# Patient Record
Sex: Female | Born: 1950 | Race: White | Hispanic: No | Marital: Married | Smoking: Former smoker
Health system: Southern US, Community
[De-identification: ages and names within clinical notes are randomized; demographics above are authoritative.]

## PROBLEM LIST (undated history)

## (undated) DIAGNOSIS — M1990 Unspecified osteoarthritis, unspecified site: Secondary | ICD-10-CM

## (undated) DIAGNOSIS — Z9889 Other specified postprocedural states: Secondary | ICD-10-CM

## (undated) DIAGNOSIS — M6530 Trigger finger, unspecified finger: Secondary | ICD-10-CM

## (undated) DIAGNOSIS — K219 Gastro-esophageal reflux disease without esophagitis: Secondary | ICD-10-CM

## (undated) DIAGNOSIS — R112 Nausea with vomiting, unspecified: Secondary | ICD-10-CM

## (undated) DIAGNOSIS — B019 Varicella without complication: Secondary | ICD-10-CM

## (undated) DIAGNOSIS — K635 Polyp of colon: Secondary | ICD-10-CM

## (undated) HISTORY — DX: Unspecified osteoarthritis, unspecified site: M19.90

## (undated) HISTORY — DX: Varicella without complication: B01.9

## (undated) HISTORY — DX: Polyp of colon: K63.5

## (undated) HISTORY — PX: TRIGGER FINGER RELEASE: SHX641

## (undated) HISTORY — DX: Trigger finger, unspecified finger: M65.30

## (undated) HISTORY — PX: BREAST BIOPSY: SHX20

## (undated) HISTORY — PX: TUBAL LIGATION: SHX77

## (undated) HISTORY — PX: KNEE ARTHROSCOPY: SUR90

---
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
## 1986-12-25 HISTORY — PX: AUGMENTATION MAMMAPLASTY: SUR837

## 2010-12-25 LAB — HM COLONOSCOPY

## 2012-06-25 ENCOUNTER — Ambulatory Visit (INDEPENDENT_AMBULATORY_CARE_PROVIDER_SITE_OTHER): Payer: PRIVATE HEALTH INSURANCE | Admitting: Internal Medicine

## 2012-06-25 ENCOUNTER — Encounter: Payer: Self-pay | Admitting: Internal Medicine

## 2012-06-25 VITALS — BP 140/80 | HR 78 | Temp 98.8°F | Ht 63.25 in | Wt 124.5 lb

## 2012-06-25 DIAGNOSIS — M1990 Unspecified osteoarthritis, unspecified site: Secondary | ICD-10-CM

## 2012-06-25 DIAGNOSIS — F411 Generalized anxiety disorder: Secondary | ICD-10-CM

## 2012-06-25 DIAGNOSIS — M129 Arthropathy, unspecified: Secondary | ICD-10-CM

## 2012-06-25 DIAGNOSIS — Z0000 Encounter for general adult medical examination without abnormal findings: Secondary | ICD-10-CM

## 2012-06-25 DIAGNOSIS — F419 Anxiety disorder, unspecified: Secondary | ICD-10-CM | POA: Insufficient documentation

## 2012-06-25 LAB — CBC WITH DIFFERENTIAL/PLATELET
Basophils Relative: 1 % (ref 0.0–3.0)
Eosinophils Relative: 1.4 % (ref 0.0–5.0)
HCT: 41.9 % (ref 36.0–46.0)
Lymphs Abs: 2.1 10*3/uL (ref 0.7–4.0)
MCV: 89.8 fl (ref 78.0–100.0)
Monocytes Absolute: 0.6 10*3/uL (ref 0.1–1.0)
Platelets: 233 10*3/uL (ref 150.0–400.0)
WBC: 6.7 10*3/uL (ref 4.5–10.5)

## 2012-06-25 LAB — COMPREHENSIVE METABOLIC PANEL
Alkaline Phosphatase: 73 U/L (ref 39–117)
BUN: 20 mg/dL (ref 6–23)
Creatinine, Ser: 0.7 mg/dL (ref 0.4–1.2)
Glucose, Bld: 104 mg/dL — ABNORMAL HIGH (ref 70–99)
Sodium: 140 mEq/L (ref 135–145)
Total Bilirubin: 1.6 mg/dL — ABNORMAL HIGH (ref 0.3–1.2)

## 2012-06-25 LAB — LIPID PANEL
Cholesterol: 231 mg/dL — ABNORMAL HIGH (ref 0–200)
HDL: 76 mg/dL (ref >39.00)
Triglycerides: 65 mg/dL (ref 0.0–149.0)
VLDL: 13 mg/dL (ref 0.0–40.0)

## 2012-06-25 LAB — LDL CHOLESTEROL, DIRECT: Direct LDL: 136.7 mg/dL

## 2012-06-25 MED ORDER — LORAZEPAM 0.5 MG PO TABS: 0.5000 mg | ORAL_TABLET | Freq: Two times a day (BID) | ORAL | Status: AC | PRN

## 2012-06-25 NOTE — Assessment & Plan Note (Signed)
Patient with arthralgia and thickened PIP joints on exam. Symptoms are most consistent with rheumatoid arthritis. Will check ANA, rheumatoid factor, ESR, CRP with labs. She will continue to use nonsteroidals as needed. Followup one month.

## 2012-06-25 NOTE — Progress Notes (Signed)
Subjective:    Patient ID: Rachael Graham, female    DOB: 1951-04-01, 61 y.o.   MRN: [id number]  HPI 62 year old female with history of arthralgia presents to establish care. She reports she is generally feeling well. She has chronic history of joint pain mostly in her hands and her right shoulder. She notes that the pain in her hands most prominent in the morning but improves with use. She notes some thickened joints especially in her hands. This has limited use of her hands. She also notes some pain in her right shoulder with movement. However, she attributes this to overuse from previous history of playing tennis. She does not have weakness in her right arm.  She also notes a history of anxiety in the past when flying. In the past, she has taken lorazepam with resolution of her symptoms.  Aside from this, she reports she is feeling well. She is up-to-date on health maintenance.  Outpatient Encounter Prescriptions as of 06/25/2012  Medication Sig Dispense Refill  . diphenhydrAMINE (SOMINEX) 25 MG tablet Take 25 mg by mouth at bedtime as needed.      Marland Kitchen LORazepam (ATIVAN) 0.5 MG tablet Take 1 tablet (0.5 mg total) by mouth 2 (two) times daily as needed for anxiety.  60 tablet  3    Review of Systems  Constitutional: Negative for fever, chills, appetite change, fatigue and unexpected weight change.  HENT: Negative for ear pain, congestion, sore throat, trouble swallowing, neck pain, voice change and sinus pressure.   Eyes: Negative for visual disturbance.  Respiratory: Negative for cough, shortness of breath, wheezing and stridor.   Cardiovascular: Negative for chest pain, palpitations and leg swelling.  Gastrointestinal: Negative for nausea, vomiting, abdominal pain, diarrhea, constipation, blood in stool, abdominal distention and anal bleeding.  Genitourinary: Negative for dysuria and flank pain.  Musculoskeletal: Positive for joint swelling and arthralgias. Negative for myalgias and gait  problem.  Skin: Negative for color change and rash.  Neurological: Negative for dizziness and headaches.  Hematological: Negative for adenopathy. Does not bruise/bleed easily.  Psychiatric/Behavioral: Negative for suicidal ideas, disturbed wake/sleep cycle and dysphoric mood. The patient is nervous/anxious (with air travel).    BP 140/80  Pulse 78  Temp 98.8 F (37.1 C) (Oral)  Ht 5' 3.25" (1.607 m)  Wt 124 lb 8 oz (56.473 kg)  BMI 21.88 kg/m2  SpO2 97%     Objective:   Physical Exam  Constitutional: She is oriented to person, place, and time. She appears well-developed and well-nourished. No distress.  HENT:  Head: Normocephalic and atraumatic.  Right Ear: External ear normal.  Left Ear: External ear normal.  Nose: Nose normal.  Mouth/Throat: Oropharynx is clear and moist. No oropharyngeal exudate.  Eyes: Conjunctivae are normal. Pupils are equal, round, and reactive to light. Right eye exhibits no discharge. Left eye exhibits no discharge. No scleral icterus.  Neck: Normal range of motion. Neck supple. No tracheal deviation present. No thyromegaly present.  Cardiovascular: Normal rate, regular rhythm, normal heart sounds and intact distal pulses.  Exam reveals no gallop and no friction rub.   No murmur heard. Pulmonary/Chest: Effort normal and breath sounds normal. No respiratory distress. She has no wheezes. She has no rales. She exhibits no tenderness.  Abdominal: Soft. Bowel sounds are normal. She exhibits no distension and no mass. There is no tenderness. There is no guarding.  Musculoskeletal: She exhibits no edema and no tenderness.       Right hand: She exhibits decreased range of motion  and bony tenderness.       Left hand: She exhibits decreased range of motion and bony tenderness.  Lymphadenopathy:    She has no cervical adenopathy.  Neurological: She is alert and oriented to person, place, and time. No cranial nerve deficit. She exhibits normal muscle tone.  Coordination normal.  Skin: Skin is warm and dry. No rash noted. She is not diaphoretic. No erythema. No pallor.  Psychiatric: She has a normal mood and affect. Her behavior is normal. Judgment and thought content normal.          Assessment & Plan:

## 2012-06-25 NOTE — Assessment & Plan Note (Signed)
Patient has some anxiety with air travel. Will continue lorazepam as needed.

## 2012-06-26 LAB — RHEUMATOID FACTOR: Rhuematoid fact SerPl-aCnc: <10 IU/mL (ref <=14)

## 2012-07-30 ENCOUNTER — Ambulatory Visit: Payer: PRIVATE HEALTH INSURANCE | Admitting: Internal Medicine

## 2012-10-23 ENCOUNTER — Ambulatory Visit: Payer: PRIVATE HEALTH INSURANCE | Admitting: Internal Medicine

## 2012-11-14 ENCOUNTER — Ambulatory Visit (INDEPENDENT_AMBULATORY_CARE_PROVIDER_SITE_OTHER): Payer: PRIVATE HEALTH INSURANCE

## 2012-11-14 DIAGNOSIS — Z23 Encounter for immunization: Secondary | ICD-10-CM

## 2013-02-16 LAB — HM MAMMOGRAPHY: HM Mammogram: NORMAL

## 2013-10-15 ENCOUNTER — Encounter: Payer: PRIVATE HEALTH INSURANCE | Admitting: Internal Medicine

## 2013-10-16 ENCOUNTER — Encounter: Payer: Self-pay | Admitting: Internal Medicine

## 2013-10-16 ENCOUNTER — Ambulatory Visit (INDEPENDENT_AMBULATORY_CARE_PROVIDER_SITE_OTHER): Payer: 59 | Admitting: Internal Medicine

## 2013-10-16 VITALS — BP 122/72 | HR 74 | Temp 98.3°F | Ht 63.0 in | Wt 128.0 lb

## 2013-10-16 DIAGNOSIS — Z23 Encounter for immunization: Secondary | ICD-10-CM

## 2013-10-16 DIAGNOSIS — Z0000 Encounter for general adult medical examination without abnormal findings: Secondary | ICD-10-CM

## 2013-10-16 DIAGNOSIS — Z1239 Encounter for other screening for malignant neoplasm of breast: Secondary | ICD-10-CM | POA: Insufficient documentation

## 2013-10-16 DIAGNOSIS — R9431 Abnormal electrocardiogram [ECG] [EKG]: Secondary | ICD-10-CM

## 2013-10-16 MED ORDER — LORAZEPAM 0.5 MG PO TABS: 0.5000 mg | ORAL_TABLET | Freq: Two times a day (BID) | ORAL | Status: DC | PRN

## 2013-10-16 NOTE — Progress Notes (Signed)
Subjective:    Patient ID: Rachael Graham, female    DOB: 06-02-1951, 62 y.o.   MRN: [id number]  HPI 62 year old female presents for annual exam. She reports she is generally feeling well. She denies any concerns today. She continues to follow a healthy diet and get regular physical activity. She was recently seen by her OB/GYN who performed breast exam, pelvic exam, and Pap smear which she reports were normal. She would like to have a flu shot today.  Outpatient Encounter Prescriptions as of 10/16/2013  Medication Sig Dispense Refill  . diphenhydrAMINE (SOMINEX) 25 MG tablet Take 25 mg by mouth at bedtime as needed.      Marland Kitchen LORazepam (ATIVAN) 0.5 MG tablet Take 1 tablet (0.5 mg total) by mouth 2 (two) times daily as needed for anxiety.  60 tablet  3   No facility-administered encounter medications on file as of 10/16/2013.   BP 122/72  Pulse 74  Temp(Src) 98.3 F (36.8 C) (Oral)  Ht 5\' 3"  (1.6 m)  Wt 128 lb (58.06 kg)  BMI 22.68 kg/m2  SpO2 95%  Review of Systems  Constitutional: Negative for fever, chills, appetite change, fatigue and unexpected weight change.  HENT: Negative for congestion, ear pain, sinus pressure, sore throat, trouble swallowing and voice change.   Eyes: Negative for visual disturbance.  Respiratory: Negative for cough, shortness of breath, wheezing and stridor.   Cardiovascular: Negative for chest pain, palpitations and leg swelling.  Gastrointestinal: Negative for nausea, vomiting, abdominal pain, diarrhea, constipation, blood in stool, abdominal distention and anal bleeding.  Genitourinary: Negative for dysuria and flank pain.  Musculoskeletal: Negative for arthralgias, gait problem, myalgias and neck pain.  Skin: Negative for color change and rash.  Neurological: Negative for dizziness and headaches.  Hematological: Negative for adenopathy. Does not bruise/bleed easily.  Psychiatric/Behavioral: Negative for suicidal ideas, sleep disturbance and dysphoric  mood. The patient is not nervous/anxious.        Objective:   Physical Exam  Constitutional: She is oriented to person, place, and time. She appears well-developed and well-nourished. No distress.  HENT:  Head: Normocephalic and atraumatic.  Right Ear: External ear normal.  Left Ear: External ear normal.  Nose: Nose normal.  Mouth/Throat: Oropharynx is clear and moist. No oropharyngeal exudate.  Eyes: Conjunctivae are normal. Pupils are equal, round, and reactive to light. Right eye exhibits no discharge. Left eye exhibits no discharge. No scleral icterus.  Neck: Normal range of motion. Neck supple. No tracheal deviation present. No thyromegaly present.  Cardiovascular: Normal rate, regular rhythm, normal heart sounds and intact distal pulses.  Exam reveals no gallop and no friction rub.   No murmur heard. Pulmonary/Chest: Effort normal and breath sounds normal. No accessory muscle usage. Not tachypneic. No respiratory distress. She has no decreased breath sounds. She has no wheezes. She has no rhonchi. She has no rales. She exhibits no tenderness.  Abdominal: Soft. Bowel sounds are normal. She exhibits no distension. There is no tenderness.  Musculoskeletal: Normal range of motion. She exhibits no edema and no tenderness.  Lymphadenopathy:    She has no cervical adenopathy.  Neurological: She is alert and oriented to person, place, and time. No cranial nerve deficit. She exhibits normal muscle tone. Coordination normal.  Skin: Skin is warm and dry. No rash noted. She is not diaphoretic. No erythema. No pallor.  Psychiatric: She has a normal mood and affect. Her behavior is normal. Judgment and thought content normal.  Assessment & Plan:

## 2013-10-16 NOTE — Assessment & Plan Note (Signed)
General medical exam normal today. Breast and pelvic exam deferred as recently completed by her OB/GYN. Will request recent Pap smear report, mammogram report. Encouraged continued healthy diet and regular physical activity. Will check fasting labs including CBC, CMP, lipid profile. Followup one year and as needed.

## 2013-10-27 ENCOUNTER — Other Ambulatory Visit: Payer: 59

## 2013-11-10 ENCOUNTER — Other Ambulatory Visit (INDEPENDENT_AMBULATORY_CARE_PROVIDER_SITE_OTHER): Payer: 59

## 2013-11-10 DIAGNOSIS — Z0000 Encounter for general adult medical examination without abnormal findings: Secondary | ICD-10-CM

## 2013-11-10 LAB — COMPREHENSIVE METABOLIC PANEL
ALT: 31 U/L (ref 0–35)
AST: 28 U/L (ref 0–37)
Alkaline Phosphatase: 77 U/L (ref 39–117)
BUN: 20 mg/dL (ref 6–23)
Calcium: 9.5 mg/dL (ref 8.4–10.5)
Creatinine, Ser: 0.7 mg/dL (ref 0.4–1.2)
Total Bilirubin: 1.3 mg/dL — ABNORMAL HIGH (ref 0.3–1.2)

## 2013-11-10 LAB — TSH: TSH: 1.24 u[IU]/mL (ref 0.35–5.50)

## 2013-11-10 LAB — LIPID PANEL
Cholesterol: 231 mg/dL — ABNORMAL HIGH (ref 0–200)
HDL: 64.4 mg/dL (ref >39.00)
Total CHOL/HDL Ratio: 4
Triglycerides: 58 mg/dL (ref 0.0–149.0)
VLDL: 11.6 mg/dL (ref 0.0–40.0)

## 2013-11-10 LAB — CBC WITH DIFFERENTIAL/PLATELET
Basophils Absolute: 0 10*3/uL (ref 0.0–0.1)
Eosinophils Relative: 1.7 % (ref 0.0–5.0)
Lymphocytes Relative: 38.8 % (ref 12.0–46.0)
Lymphs Abs: 2.3 10*3/uL (ref 0.7–4.0)
MCV: 86.6 fl (ref 78.0–100.0)
Monocytes Absolute: 0.5 10*3/uL (ref 0.1–1.0)
Neutrophils Relative %: 50.7 % (ref 43.0–77.0)
Platelets: 246 10*3/uL (ref 150.0–400.0)
RBC: 4.77 Mil/uL (ref 3.87–5.11)
RDW: 12.8 % (ref 11.5–14.6)
WBC: 5.8 10*3/uL (ref 4.5–10.5)

## 2013-11-11 LAB — VITAMIN D 25 HYDROXY (VIT D DEFICIENCY, FRACTURES): Vit D, 25-Hydroxy: 52 ng/mL (ref 30–89)

## 2014-09-10 ENCOUNTER — Telehealth: Payer: Self-pay | Admitting: Internal Medicine

## 2014-10-01 ENCOUNTER — Ambulatory Visit (INDEPENDENT_AMBULATORY_CARE_PROVIDER_SITE_OTHER): Payer: 59 | Admitting: Internal Medicine

## 2014-10-01 ENCOUNTER — Encounter: Payer: Self-pay | Admitting: Internal Medicine

## 2014-10-01 VITALS — BP 136/82 | HR 72 | Temp 98.3°F | Ht 63.0 in | Wt 127.0 lb

## 2014-10-01 DIAGNOSIS — Z0000 Encounter for general adult medical examination without abnormal findings: Secondary | ICD-10-CM

## 2014-10-01 DIAGNOSIS — F419 Anxiety disorder, unspecified: Secondary | ICD-10-CM

## 2014-10-01 MED ORDER — LORAZEPAM 0.5 MG PO TABS: 0.5000 mg | ORAL_TABLET | Freq: Two times a day (BID) | ORAL | Status: DC | PRN

## 2014-10-01 NOTE — Patient Instructions (Signed)
Labs in November.  Follow up in 1 year or sooner as needed.

## 2014-10-01 NOTE — Assessment & Plan Note (Signed)
Symptoms well controlled with only prn Lorazepam. Will continue.

## 2014-10-01 NOTE — Progress Notes (Signed)
Pre visit review using our clinic review tool, if applicable. No additional management support is needed unless otherwise documented below in the visit note. 

## 2014-10-01 NOTE — Progress Notes (Signed)
   Subjective:    Patient ID: Rachael Graham, female    DOB: 11/17/1951, 63 y.o.   MRN: [id number]  HPI 63YO female presents for follow up.  Generally, feeling well. No concerns today. Plans to follow up with OB for pelvic, pap and mammogram. Would like to refill Lorazepam, as she uses this for anxiety during flights.  Review of Systems  Constitutional: Negative for fever, chills, appetite change, fatigue and unexpected weight change.  Eyes: Negative for visual disturbance.  Respiratory: Negative for shortness of breath.   Cardiovascular: Negative for chest pain and leg swelling.  Gastrointestinal: Negative for abdominal pain.  Skin: Negative for color change and rash.  Hematological: Negative for adenopathy. Does not bruise/bleed easily.  Psychiatric/Behavioral: Negative for dysphoric mood. The patient is nervous/anxious (during flights).        Objective:    BP 136/82  Pulse 72  Temp(Src) 98.3 F (36.8 C) (Oral)  Ht 5\' 3"  (1.6 m)  Wt 127 lb (57.607 kg)  BMI 22.50 kg/m2  SpO2 96% Physical Exam  Constitutional: She is oriented to person, place, and time. She appears well-developed and well-nourished. No distress.  HENT:  Head: Normocephalic and atraumatic.  Right Ear: External ear normal.  Left Ear: External ear normal.  Nose: Nose normal.  Mouth/Throat: Oropharynx is clear and moist. No oropharyngeal exudate.  Eyes: Conjunctivae are normal. Pupils are equal, round, and reactive to light. Right eye exhibits no discharge. Left eye exhibits no discharge. No scleral icterus.  Neck: Normal range of motion. Neck supple. No tracheal deviation present. No thyromegaly present.  Cardiovascular: Normal rate, regular rhythm, normal heart sounds and intact distal pulses.  Exam reveals no gallop and no friction rub.   No murmur heard. Pulmonary/Chest: Effort normal and breath sounds normal. No accessory muscle usage. Not tachypneic. No respiratory distress. She has no decreased breath  sounds. She has no wheezes. She has no rhonchi. She has no rales. She exhibits no tenderness.  Musculoskeletal: Normal range of motion. She exhibits no edema and no tenderness.  Lymphadenopathy:    She has no cervical adenopathy.  Neurological: She is alert and oriented to person, place, and time. No cranial nerve deficit. She exhibits normal muscle tone. Coordination normal.  Skin: Skin is warm and dry. No rash noted. She is not diaphoretic. No erythema. No pallor.  Psychiatric: She has a normal mood and affect. Her behavior is normal. Judgment and thought content normal.          Assessment & Plan:   Problem List Items Addressed This Visit     Unprioritized   Anxiety - Primary     Symptoms well controlled with only prn Lorazepam. Will continue.    Relevant Medications      LORazepam (ATIVAN) tablet   Routine general medical examination at a health care facility   Relevant Medications      LORazepam (ATIVAN) tablet   Other Relevant Orders      CBC with Differential      Comprehensive metabolic panel      Lipid panel      Microalbumin / creatinine urine ratio      Vit D  25 hydroxy (rtn osteoporosis monitoring)      TSH       Return in about 1 year (around 10/02/2015) for Recheck.

## 2014-10-19 ENCOUNTER — Other Ambulatory Visit: Payer: 59

## 2014-10-26 ENCOUNTER — Other Ambulatory Visit (INDEPENDENT_AMBULATORY_CARE_PROVIDER_SITE_OTHER): Payer: 59

## 2014-10-26 ENCOUNTER — Other Ambulatory Visit: Payer: Self-pay | Admitting: *Deleted

## 2014-10-26 DIAGNOSIS — Z0000 Encounter for general adult medical examination without abnormal findings: Secondary | ICD-10-CM

## 2014-10-26 DIAGNOSIS — E875 Hyperkalemia: Secondary | ICD-10-CM

## 2014-10-26 LAB — MICROALBUMIN / CREATININE URINE RATIO
Creatinine,U: 40.1 mg/dL
MICROALB UR: 0.7 mg/dL (ref 0.0–1.9)
MICROALB/CREAT RATIO: 1.7 mg/g (ref 0.0–30.0)

## 2014-10-26 LAB — COMPREHENSIVE METABOLIC PANEL
ALT: 21 U/L (ref 0–35)
AST: 21 U/L (ref 0–37)
Albumin: 3.8 g/dL (ref 3.5–5.2)
Alkaline Phosphatase: 71 U/L (ref 39–117)
BUN: 16 mg/dL (ref 6–23)
CO2: 27 meq/L (ref 19–32)
Calcium: 9.5 mg/dL (ref 8.4–10.5)
Chloride: 104 mEq/L (ref 96–112)
Creatinine, Ser: 0.8 mg/dL (ref 0.4–1.2)
GFR: 77.51 mL/min (ref >60.00)
Glucose, Bld: 87 mg/dL (ref 70–99)
Potassium: 5.5 mEq/L — ABNORMAL HIGH (ref 3.5–5.1)
Sodium: 141 mEq/L (ref 135–145)
Total Bilirubin: 1.9 mg/dL — ABNORMAL HIGH (ref 0.2–1.2)
Total Protein: 7.1 g/dL (ref 6.0–8.3)

## 2014-10-26 LAB — VITAMIN D 25 HYDROXY (VIT D DEFICIENCY, FRACTURES): VITD: 36.54 ng/mL (ref 30.00–100.00)

## 2014-10-26 LAB — CBC WITH DIFFERENTIAL/PLATELET
Basophils Absolute: 0.1 10*3/uL (ref 0.0–0.1)
Basophils Relative: 0.8 % (ref 0.0–3.0)
EOS PCT: 3.4 % (ref 0.0–5.0)
Eosinophils Absolute: 0.2 10*3/uL (ref 0.0–0.7)
HEMATOCRIT: 42.1 % (ref 36.0–46.0)
HEMOGLOBIN: 14 g/dL (ref 12.0–15.0)
LYMPHS ABS: 2.3 10*3/uL (ref 0.7–4.0)
Lymphocytes Relative: 37.2 % (ref 12.0–46.0)
MCHC: 33.2 g/dL (ref 30.0–36.0)
MCV: 87.8 fl (ref 78.0–100.0)
MONO ABS: 0.6 10*3/uL (ref 0.1–1.0)
Monocytes Relative: 10.5 % (ref 3.0–12.0)
NEUTROS ABS: 3 10*3/uL (ref 1.4–7.7)
Neutrophils Relative %: 48.1 % (ref 43.0–77.0)
PLATELETS: 230 10*3/uL (ref 150.0–400.0)
RBC: 4.79 Mil/uL (ref 3.87–5.11)
RDW: 13.2 % (ref 11.5–15.5)
WBC: 6.2 10*3/uL (ref 4.0–10.5)

## 2014-10-26 LAB — LIPID PANEL
Cholesterol: 218 mg/dL — ABNORMAL HIGH (ref 0–200)
HDL: 60.9 mg/dL (ref >39.00)
LDL Cholesterol: 138 mg/dL — ABNORMAL HIGH (ref 0–99)
NONHDL: 157.1
Total CHOL/HDL Ratio: 4
Triglycerides: 96 mg/dL (ref 0.0–149.0)
VLDL: 19.2 mg/dL (ref 0.0–40.0)

## 2014-10-26 LAB — TSH: TSH: 0.42 u[IU]/mL (ref 0.35–4.50)

## 2014-10-29 ENCOUNTER — Other Ambulatory Visit (INDEPENDENT_AMBULATORY_CARE_PROVIDER_SITE_OTHER): Payer: 59

## 2014-10-29 DIAGNOSIS — E875 Hyperkalemia: Secondary | ICD-10-CM

## 2014-10-29 LAB — BASIC METABOLIC PANEL
BUN: 17 mg/dL (ref 6–23)
CALCIUM: 9.2 mg/dL (ref 8.4–10.5)
CO2: 31 meq/L (ref 19–32)
Chloride: 104 mEq/L (ref 96–112)
Creatinine, Ser: 1 mg/dL (ref 0.4–1.2)
GFR: 62.05 mL/min (ref >60.00)
Glucose, Bld: 89 mg/dL (ref 70–99)
Potassium: 4.8 mEq/L (ref 3.5–5.1)
SODIUM: 141 meq/L (ref 135–145)

## 2015-01-05 LAB — HM MAMMOGRAPHY: HM Mammogram: NORMAL

## 2015-01-05 LAB — HM PAP SMEAR: HM PAP: NORMAL

## 2015-10-04 ENCOUNTER — Ambulatory Visit: Payer: 59 | Admitting: Internal Medicine

## 2015-10-06 ENCOUNTER — Ambulatory Visit (INDEPENDENT_AMBULATORY_CARE_PROVIDER_SITE_OTHER): Payer: 59 | Admitting: Internal Medicine

## 2015-10-06 ENCOUNTER — Encounter: Payer: Self-pay | Admitting: Internal Medicine

## 2015-10-06 VITALS — BP 136/71 | HR 73 | Temp 98.1°F | Ht 63.0 in | Wt 127.5 lb

## 2015-10-06 DIAGNOSIS — F419 Anxiety disorder, unspecified: Secondary | ICD-10-CM

## 2015-10-06 DIAGNOSIS — Z23 Encounter for immunization: Secondary | ICD-10-CM

## 2015-10-06 DIAGNOSIS — Z0000 Encounter for general adult medical examination without abnormal findings: Secondary | ICD-10-CM

## 2015-10-06 MED ORDER — LORAZEPAM 0.5 MG PO TABS: 0.5000 mg | ORAL_TABLET | Freq: Two times a day (BID) | ORAL | Status: DC | PRN

## 2015-10-06 NOTE — Progress Notes (Signed)
Pre visit review using our clinic review tool, if applicable. No additional management support is needed unless otherwise documented below in the visit note. 

## 2015-10-06 NOTE — Patient Instructions (Signed)
Follow up in 1 year.  [email or web address]

## 2015-10-06 NOTE — Progress Notes (Signed)
Subjective:    Patient ID: Rachael Graham, female    DOB: Sep 17, 1951, 64 y.o.   MRN: [id number]  HPI  64YO female presents for follow up.  Feeling well. No concerns. Started back walking again. Only uses Lorazepam when flying or traveling.  Wt Readings from Last 3 Encounters:  10/06/15 127 lb 8 oz (57.834 kg)  10/01/14 127 lb (57.607 kg)  10/16/13 128 lb (58.06 kg)   BP Readings from Last 3 Encounters:  10/06/15 136/71  10/01/14 136/82  10/16/13 122/72    Past Medical History  Diagnosis Date  . Chicken pox   . Colon polyp   . Trigger finger     Followed at [facility] Dr. Veronia Beets  . Arthritis     knee, s/p steroid injection Dr. Alvan Dame   Family History  Problem Relation Age of Onset  . Arthritis Mother   . Hyperlipidemia Mother   . Heart disease Mother   . Hypertension Mother   . Stroke Mother   . Arthritis Father   . Hyperlipidemia Father   . Heart disease Father   . Hypertension Father   . Parkinsonism Father   . Arthritis Maternal Grandmother   . Cancer Maternal Grandmother     colon  . Arthritis Maternal Grandfather   . Cancer Maternal Grandfather     colon  . Crohn's disease Sister   . Obesity Sister    Past Surgical History  Procedure Laterality Date  . Tonsillectomy and adenoidectomy  1964  . Knee arthroscopy    . Breast biopsy      bilateral, both benign  . Tubal ligation    . Vaginal delivery    . Trigger finger release      Dr. Veronia Beets   Social History   Social History  . Marital Status: Married    Spouse Name: N/A  . Number of Children: N/A  . Years of Education: N/A   Social History Main Topics  . Smoking status: Never Smoker   . Smokeless tobacco: None  . Alcohol Use: Yes  . Drug Use: No  . Sexual Activity: Not Asked   Other Topics Concern  . None   Social History Narrative   Lives in [place]. From [place]. Two children in [place].      Diet - Regular   Exercise - tennis    Review of Systems  Constitutional:  Negative for fever, chills, appetite change, fatigue and unexpected weight change.  Eyes: Negative for visual disturbance.  Respiratory: Negative for shortness of breath.   Cardiovascular: Negative for chest pain and leg swelling.  Gastrointestinal: Negative for nausea, vomiting, abdominal pain, diarrhea and constipation.  Musculoskeletal: Negative for myalgias and arthralgias.  Skin: Negative for color change and rash.  Hematological: Negative for adenopathy. Does not bruise/bleed easily.  Psychiatric/Behavioral: Negative for suicidal ideas, sleep disturbance and dysphoric mood. The patient is not nervous/anxious.        Objective:    BP 136/71 mmHg  Pulse 73  Temp(Src) 98.1 F (36.7 C) (Oral)  Ht 5\' 3"  (1.6 m)  Wt 127 lb 8 oz (57.834 kg)  BMI 22.59 kg/m2  SpO2 99% Physical Exam  Constitutional: She is oriented to person, place, and time. She appears well-developed and well-nourished. No distress.  HENT:  Head: Normocephalic and atraumatic.  Right Ear: External ear normal.  Left Ear: External ear normal.  Nose: Nose normal.  Mouth/Throat: Oropharynx is clear and moist. No oropharyngeal exudate.  Eyes: Conjunctivae are normal. Pupils are equal,  round, and reactive to light. Right eye exhibits no discharge. Left eye exhibits no discharge. No scleral icterus.  Neck: Normal range of motion. Neck supple. No tracheal deviation present. No thyromegaly present.  Cardiovascular: Normal rate, regular rhythm, normal heart sounds and intact distal pulses.  Exam reveals no gallop and no friction rub.   No murmur heard. Pulmonary/Chest: Effort normal and breath sounds normal. No respiratory distress. She has no wheezes. She has no rales. She exhibits no tenderness.  Musculoskeletal: Normal range of motion. She exhibits no edema or tenderness.  Lymphadenopathy:    She has no cervical adenopathy.  Neurological: She is alert and oriented to person, place, and time. No cranial nerve deficit. She  exhibits normal muscle tone. Coordination normal.  Skin: Skin is warm and dry. No rash noted. She is not diaphoretic. No erythema. No pallor.  Psychiatric: She has a normal mood and affect. Her behavior is normal. Judgment and thought content normal.          Assessment & Plan:   Problem List Items Addressed This Visit      Unprioritized   Anxiety - Primary    Symptoms well controlled with prn Lorazepam. Will continue.      Relevant Medications   LORazepam (ATIVAN) 0.5 MG tablet   Routine general medical examination at a health care facility   Relevant Medications   LORazepam (ATIVAN) 0.5 MG tablet       Return in about 1 year (around 10/05/2016) for Physical.

## 2015-10-06 NOTE — Assessment & Plan Note (Signed)
Symptoms well controlled with prn Lorazepam. Will continue.

## 2015-12-28 DIAGNOSIS — Z1231 Encounter for screening mammogram for malignant neoplasm of breast: Secondary | ICD-10-CM | POA: Diagnosis not present

## 2016-01-24 DIAGNOSIS — Z01419 Encounter for gynecological examination (general) (routine) without abnormal findings: Secondary | ICD-10-CM | POA: Diagnosis not present

## 2016-01-24 DIAGNOSIS — Z1211 Encounter for screening for malignant neoplasm of colon: Secondary | ICD-10-CM | POA: Diagnosis not present

## 2016-01-24 DIAGNOSIS — Z13820 Encounter for screening for osteoporosis: Secondary | ICD-10-CM | POA: Diagnosis not present

## 2016-01-25 ENCOUNTER — Other Ambulatory Visit: Payer: Self-pay | Admitting: Obstetrics & Gynecology

## 2016-01-25 DIAGNOSIS — Z13820 Encounter for screening for osteoporosis: Secondary | ICD-10-CM

## 2016-02-01 ENCOUNTER — Ambulatory Visit
Admission: RE | Admit: 2016-02-01 | Discharge: 2016-02-01 | Disposition: A | Discharge status: Home / Self Care | Payer: 59 | Source: Ambulatory Visit | Attending: Obstetrics & Gynecology | Admitting: Obstetrics & Gynecology

## 2016-02-01 DIAGNOSIS — Z13820 Encounter for screening for osteoporosis: Secondary | ICD-10-CM | POA: Insufficient documentation

## 2016-02-01 DIAGNOSIS — Z780 Asymptomatic menopausal state: Secondary | ICD-10-CM | POA: Diagnosis not present

## 2016-03-29 DIAGNOSIS — H524 Presbyopia: Secondary | ICD-10-CM | POA: Diagnosis not present

## 2016-06-23 DIAGNOSIS — M25561 Pain in right knee: Secondary | ICD-10-CM | POA: Diagnosis not present

## 2016-06-23 DIAGNOSIS — M2391 Unspecified internal derangement of right knee: Secondary | ICD-10-CM | POA: Diagnosis not present

## 2016-06-23 DIAGNOSIS — M1711 Unilateral primary osteoarthritis, right knee: Secondary | ICD-10-CM | POA: Diagnosis not present

## 2016-10-10 ENCOUNTER — Ambulatory Visit (INDEPENDENT_AMBULATORY_CARE_PROVIDER_SITE_OTHER): Payer: 59

## 2016-10-10 DIAGNOSIS — Z23 Encounter for immunization: Secondary | ICD-10-CM | POA: Diagnosis not present

## 2017-01-01 ENCOUNTER — Ambulatory Visit (INDEPENDENT_AMBULATORY_CARE_PROVIDER_SITE_OTHER): Payer: 59 | Admitting: Family

## 2017-01-01 ENCOUNTER — Encounter: Payer: Self-pay | Admitting: Family

## 2017-01-01 VITALS — BP 132/80 | HR 75 | Temp 97.6°F | Ht 63.0 in | Wt 122.0 lb

## 2017-01-01 DIAGNOSIS — F419 Anxiety disorder, unspecified: Secondary | ICD-10-CM | POA: Diagnosis not present

## 2017-01-01 MED ORDER — LORAZEPAM 0.5 MG PO TABS: 0.5000 mg | ORAL_TABLET | Freq: Two times a day (BID) | ORAL | 0 refills | Status: DC | PRN

## 2017-01-01 NOTE — Patient Instructions (Signed)
Pleasure meeting you  Welcome!

## 2017-01-01 NOTE — Progress Notes (Signed)
Subjective:    Patient ID: Rachael Graham, female    DOB: [birth year], 66 y.o.   MRN: [id number]  CC: Rachael Graham is a 66 y.o. female who presents today for follow up.   HPI: Anxiety- takes ativan occasionally when cannot sleep or when flies. Takes 1/2 tablets. Here for refill. Takes approx 3 tabs per month.   No depression, panic attacks.   Follows with [facility] , Dr. Leonides Graham. Will return for CPE, labs        HISTORY:  Past Medical History:  Diagnosis Date  . Arthritis    knee, s/p steroid injection Dr. Alvan Graham  . Chicken pox   . Colon polyp   . Trigger finger    Followed at [facility], Dr. Veronia Graham   Past Surgical History:  Procedure Laterality Date  . BREAST BIOPSY     bilateral, both benign  . KNEE ARTHROSCOPY    . TONSILLECTOMY AND ADENOIDECTOMY  1964  . TRIGGER FINGER RELEASE     Dr. Veronia Graham  . TUBAL LIGATION    . VAGINAL DELIVERY     Family History  Problem Relation Age of Onset  . Arthritis Mother   . Hyperlipidemia Mother   . Heart disease Mother   . Hypertension Mother   . Stroke Mother   . Arthritis Father   . Hyperlipidemia Father   . Heart disease Father   . Hypertension Father   . Parkinsonism Father   . Arthritis Maternal Grandmother   . Cancer Maternal Grandmother     colon  . Arthritis Maternal Grandfather   . Cancer Maternal Grandfather     colon  . Crohn's disease Sister   . Obesity Sister     Allergies: Patient has no known allergies. No current outpatient prescriptions on file prior to visit.   No current facility-administered medications on file prior to visit.     Social History  Substance Use Topics  . Smoking status: Never Smoker  . Smokeless tobacco: Not on file  . Alcohol use Yes    Review of Systems  Constitutional: Negative for chills and fever.  Respiratory: Negative for cough.   Cardiovascular: Negative for chest pain and palpitations.  Gastrointestinal: Negative for nausea and vomiting.    Psychiatric/Behavioral: Negative for sleep disturbance and suicidal ideas. The patient is nervous/anxious.       Objective:    BP 132/80   Pulse 75   Temp 97.6 F (36.4 C) (Oral)   Ht 5\' 3"  (1.6 m)   Wt 122 lb (55.3 kg)   SpO2 95%   BMI 21.61 kg/m  BP Readings from Last 3 Encounters:  01/01/17 132/80  10/06/15 136/71  10/01/14 136/82   Wt Readings from Last 3 Encounters:  01/01/17 122 lb (55.3 kg)  10/06/15 127 lb 8 oz (57.8 kg)  10/01/14 127 lb (57.6 kg)    Physical Exam  Constitutional: She appears well-developed and well-nourished.  Eyes: Conjunctivae are normal.  Cardiovascular: Normal rate, regular rhythm, normal heart sounds and normal pulses.   Pulmonary/Chest: Effort normal and breath sounds normal. She has no wheezes. She has no rhonchi. She has no rales.  Neurological: She is alert.  Skin: Skin is warm and dry.  Psychiatric: She has a normal mood and affect. Her speech is normal and behavior is normal. Thought content normal.  Vitals reviewed.      Assessment & Plan:   Problem List Items Addressed This Visit      Other   Anxiety -  Primary    Occasional use. Discussed risks of BZDs .I looked up patient on [place] Controlled Substances Reporting System and saw no activity that raised concern of inappropriate use.  New CSC. Will continue.        Relevant Medications   LORazepam (ATIVAN) 0.5 MG tablet   Routine general medical examination at a health care facility       I have discontinued Ms. Maugeri's diphenhydrAMINE. I am also having her maintain her LORazepam.   Meds ordered this encounter  Medications  . LORazepam (ATIVAN) 0.5 MG tablet    Sig: Take 1 tablet (0.5 mg total) by mouth 2 (two) times daily as needed for anxiety.    Dispense:  60 tablet    Refill:  0    Order Specific Question:   Supervising Provider    Answer:   Rachael Graham [[id number]]    Return precautions given.   Risks, benefits, and alternatives of the medications and  treatment plan prescribed today were discussed, and patient expressed understanding.   Education regarding symptom management and diagnosis given to patient on AVS.  Continue to follow with Rachael Paris, FNP for routine health maintenance.   Rachael Graham and I agreed with plan.   Rachael Paris, FNP

## 2017-01-01 NOTE — Progress Notes (Signed)
Pre visit review using our clinic review tool, if applicable. No additional management support is needed unless otherwise documented below in the visit note. 

## 2017-01-01 NOTE — Assessment & Plan Note (Addendum)
Occasional use. Discussed risks of BZDs .I looked up patient on [place] Controlled Substances Reporting System and saw no activity that raised concern of inappropriate use.  New CSC. Will continue.

## 2017-01-02 ENCOUNTER — Ambulatory Visit: Payer: 59 | Admitting: Family

## 2017-02-21 ENCOUNTER — Other Ambulatory Visit: Payer: Self-pay | Admitting: Obstetrics & Gynecology

## 2017-02-21 DIAGNOSIS — Z01419 Encounter for gynecological examination (general) (routine) without abnormal findings: Secondary | ICD-10-CM | POA: Diagnosis not present

## 2017-02-21 DIAGNOSIS — Z1231 Encounter for screening mammogram for malignant neoplasm of breast: Secondary | ICD-10-CM | POA: Diagnosis not present

## 2017-02-21 DIAGNOSIS — Z1211 Encounter for screening for malignant neoplasm of colon: Secondary | ICD-10-CM | POA: Diagnosis not present

## 2017-03-19 ENCOUNTER — Other Ambulatory Visit: Payer: Self-pay | Admitting: Obstetrics & Gynecology

## 2017-03-19 ENCOUNTER — Ambulatory Visit
Admission: RE | Admit: 2017-03-19 | Discharge: 2017-03-19 | Disposition: A | Discharge status: Home / Self Care | Payer: 59 | Source: Ambulatory Visit | Attending: Obstetrics & Gynecology | Admitting: Obstetrics & Gynecology

## 2017-03-19 DIAGNOSIS — Z1231 Encounter for screening mammogram for malignant neoplasm of breast: Secondary | ICD-10-CM | POA: Diagnosis not present

## 2017-08-01 ENCOUNTER — Telehealth: Payer: Self-pay | Admitting: Family

## 2017-08-01 NOTE — Telephone Encounter (Signed)
Please advise 

## 2017-08-01 NOTE — Telephone Encounter (Signed)
Pt called and wanted to know if you are willing to take her on. PT's husband is Tamala Ser, he is a PA at [facility]. Please advise, thank you!  Call pt @ [phone]

## 2017-08-01 NOTE — Telephone Encounter (Signed)
No I cannot at this time

## 2017-08-02 NOTE — Telephone Encounter (Signed)
Notified pt and she gave a verbal understanding.

## 2017-08-06 ENCOUNTER — Ambulatory Visit (INDEPENDENT_AMBULATORY_CARE_PROVIDER_SITE_OTHER): Payer: 59 | Admitting: Family

## 2017-08-06 VITALS — BP 126/70 | HR 67 | Temp 98.2°F | Ht 63.0 in

## 2017-08-06 DIAGNOSIS — Z23 Encounter for immunization: Secondary | ICD-10-CM | POA: Diagnosis not present

## 2017-08-06 DIAGNOSIS — F419 Anxiety disorder, unspecified: Secondary | ICD-10-CM | POA: Diagnosis not present

## 2017-08-06 LAB — COMPREHENSIVE METABOLIC PANEL
ALK PHOS: 61 U/L (ref 39–117)
ALT: 17 U/L (ref 0–35)
AST: 18 U/L (ref 0–37)
Albumin: 4.2 g/dL (ref 3.5–5.2)
BILIRUBIN TOTAL: 1.4 mg/dL — AB (ref 0.2–1.2)
BUN: 15 mg/dL (ref 6–23)
CO2: 30 meq/L (ref 19–32)
CREATININE: 0.61 mg/dL (ref 0.40–1.20)
Calcium: 8.9 mg/dL (ref 8.4–10.5)
Chloride: 105 mEq/L (ref 96–112)
GFR: 105.03 mL/min (ref >60.00)
GLUCOSE: 96 mg/dL (ref 70–99)
Potassium: 4.1 mEq/L (ref 3.5–5.1)
Sodium: 140 mEq/L (ref 135–145)
TOTAL PROTEIN: 6 g/dL (ref 6.0–8.3)

## 2017-08-06 LAB — CBC WITH DIFFERENTIAL/PLATELET
Basophils Absolute: 0.1 10*3/uL (ref 0.0–0.1)
Basophils Relative: 1.2 % (ref 0.0–3.0)
EOS ABS: 0.1 10*3/uL (ref 0.0–0.7)
EOS PCT: 2.2 % (ref 0.0–5.0)
HCT: 39.2 % (ref 36.0–46.0)
HEMOGLOBIN: 13.1 g/dL (ref 12.0–15.0)
Lymphocytes Relative: 41.2 % (ref 12.0–46.0)
Lymphs Abs: 2.2 10*3/uL (ref 0.7–4.0)
MCHC: 33.4 g/dL (ref 30.0–36.0)
MCV: 88.3 fl (ref 78.0–100.0)
MONO ABS: 0.5 10*3/uL (ref 0.1–1.0)
Monocytes Relative: 10.1 % (ref 3.0–12.0)
Neutro Abs: 2.4 10*3/uL (ref 1.4–7.7)
Neutrophils Relative %: 45.3 % (ref 43.0–77.0)
Platelets: 240 10*3/uL (ref 150.0–400.0)
RBC: 4.44 Mil/uL (ref 3.87–5.11)
RDW: 13.2 % (ref 11.5–15.5)
WBC: 5.3 10*3/uL (ref 4.0–10.5)

## 2017-08-06 LAB — LIPID PANEL
CHOL/HDL RATIO: 3
Cholesterol: 161 mg/dL (ref 0–200)
HDL: 60.7 mg/dL (ref >39.00)
LDL Cholesterol: 90 mg/dL (ref 0–99)
NONHDL: 100.65
Triglycerides: 53 mg/dL (ref 0.0–149.0)
VLDL: 10.6 mg/dL (ref 0.0–40.0)

## 2017-08-06 LAB — VITAMIN D 25 HYDROXY (VIT D DEFICIENCY, FRACTURES): VITD: 35.04 ng/mL (ref 30.00–100.00)

## 2017-08-06 LAB — TSH: TSH: 1.35 u[IU]/mL (ref 0.35–4.50)

## 2017-08-06 LAB — HEMOGLOBIN A1C: HEMOGLOBIN A1C: 5.9 % (ref 4.6–6.5)

## 2017-08-06 MED ORDER — LORAZEPAM 0.5 MG PO TABS: 0.5000 mg | ORAL_TABLET | Freq: Two times a day (BID) | ORAL | 1 refills | Status: DC | PRN

## 2017-08-06 NOTE — Progress Notes (Signed)
Pre visit review using our clinic review tool, if applicable. No additional management support is needed unless otherwise documented below in the visit note. 

## 2017-08-06 NOTE — Patient Instructions (Signed)
Pleasure seeing you!  Excited for your new granddaughter!

## 2017-08-06 NOTE — Progress Notes (Signed)
Subjective:    Patient ID: Rachael Graham, female    DOB: 1951/10/22, 66 y.o.   MRN: [id number]  CC: Rachael Graham is a 66 y.o. female who presents today for an acute visit.    HPI: Here today to follow-up on anxiety. Unchanged. Would like refill of her medication. She uses rarely, couple times a month usually when she travels.. No depression. She reports she is having grandchild in a couple months and would like to have tdap vaccine. She continues to follow with OB/GYN up-to-date on mammogram and Pap smear per patient. She would however like to have her fasting labs today.Consents to have hep C and HIV included.     HISTORY:  Past Medical History:  Diagnosis Date  . Arthritis    knee, s/p steroid injection Dr. Alvan Dame  . Chicken pox   . Colon polyp   . Trigger finger    Followed at [facility], Dr. Veronia Beets   Past Surgical History:  Procedure Laterality Date  . AUGMENTATION MAMMAPLASTY Bilateral 1988  . BREAST BIOPSY     bilateral, both benign  . KNEE ARTHROSCOPY    . TONSILLECTOMY AND ADENOIDECTOMY  1964  . TRIGGER FINGER RELEASE     Dr. Veronia Beets  . TUBAL LIGATION    . VAGINAL DELIVERY     Family History  Problem Relation Age of Onset  . Arthritis Mother   . Hyperlipidemia Mother   . Heart disease Mother   . Hypertension Mother   . Stroke Mother   . Arthritis Father   . Hyperlipidemia Father   . Heart disease Father   . Hypertension Father   . Parkinsonism Father   . Crohn's disease Sister   . Obesity Sister   . Arthritis Maternal Grandmother   . Cancer Maternal Grandmother        colon  . Arthritis Maternal Grandfather   . Cancer Maternal Grandfather        colon  . Breast cancer Neg Hx     Allergies: Patient has no known allergies. No current outpatient prescriptions on file prior to visit.   No current facility-administered medications on file prior to visit.     Social History  Substance Use Topics  . Smoking status: Never Smoker  .  Smokeless tobacco: Not on file  . Alcohol use Yes    Review of Systems  Constitutional: Negative for chills and fever.  Respiratory: Negative for cough.   Cardiovascular: Negative for chest pain and palpitations.  Gastrointestinal: Negative for nausea and vomiting.  Psychiatric/Behavioral: The patient is nervous/anxious.       Objective:    BP 126/70   Pulse 67   Temp 98.2 F (36.8 C) (Oral)   Ht 5\' 3"  (1.6 m)   SpO2 97%  BP Readings from Last 3 Encounters:  08/06/17 126/70  01/01/17 132/80  10/06/15 136/71     Physical Exam  Constitutional: She appears well-developed and well-nourished.  Eyes: Conjunctivae are normal.  Cardiovascular: Normal rate, regular rhythm, normal heart sounds and normal pulses.   Pulmonary/Chest: Effort normal and breath sounds normal. She has no wheezes. She has no rhonchi. She has no rales.  Neurological: She is alert.  Skin: Skin is warm and dry.  Psychiatric: She has a normal mood and affect. Her speech is normal and behavior is normal. Thought content normal.  Vitals reviewed.      Assessment & Plan:   Problem List Items Addressed This Visit      Other  Anxiety - Primary    Well controlled. Uses sparingly. I looked up patient on [place] Controlled Substances Reporting System and saw no activity that raised concern of inappropriate use. Refilled with one refill.        Relevant Medications   LORazepam (ATIVAN) 0.5 MG tablet   Other Relevant Orders   CBC with Differential/Platelet   Comprehensive metabolic panel   Hepatitis C antibody   HIV antibody   Lipid panel   TSH   VITAMIN D 25 Hydroxy (Vit-D Deficiency, Fractures)   Hemoglobin A1c   Need for Tdap vaccination    Advised she may get bill since not due. Following recommendations per CDC for grandparents with close contact, I advised that a tdap booster is appropriate. Given today.       Relevant Orders   Tdap vaccine greater than or equal to 7yo IM (Completed)        I  am having Ms. Sima maintain her LORazepam.   Meds ordered this encounter  Medications  . LORazepam (ATIVAN) 0.5 MG tablet    Sig: Take 1 tablet (0.5 mg total) by mouth 2 (two) times daily as needed for anxiety.    Dispense:  60 tablet    Refill:  1    Order Specific Question:   Supervising Provider    Answer:   Crecencio Mc [[id number]]    Return precautions given.   Risks, benefits, and alternatives of the medications and treatment plan prescribed today were discussed, and patient expressed understanding.   Education regarding symptom management and diagnosis given to patient on AVS.  Continue to follow with Burnard Hawthorne, FNP for routine health maintenance.   Rachael Graham and I agreed with plan.   Mable Paris, FNP

## 2017-08-06 NOTE — Assessment & Plan Note (Addendum)
Well controlled. Uses sparingly. I looked up patient on [place] Controlled Substances Reporting System and saw no activity that raised concern of inappropriate use. Refilled with one refill.

## 2017-08-06 NOTE — Assessment & Plan Note (Addendum)
Advised she may get bill since not due. Following recommendations per CDC for grandparents with close contact, I advised that a tdap booster is appropriate. Given today.

## 2017-08-07 LAB — HIV ANTIBODY (ROUTINE TESTING W REFLEX): HIV: NONREACTIVE

## 2017-08-07 LAB — HEPATITIS C ANTIBODY: HCV Ab: NONREACTIVE

## 2017-09-24 DIAGNOSIS — R42 Dizziness and giddiness: Secondary | ICD-10-CM

## 2017-09-24 HISTORY — DX: Dizziness and giddiness: R42

## 2017-09-27 DIAGNOSIS — H524 Presbyopia: Secondary | ICD-10-CM | POA: Diagnosis not present

## 2017-09-28 DIAGNOSIS — H8112 Benign paroxysmal vertigo, left ear: Secondary | ICD-10-CM | POA: Diagnosis not present

## 2017-12-16 ENCOUNTER — Ambulatory Visit
Admission: EM | Admit: 2017-12-16 | Discharge: 2017-12-16 | Disposition: A | Discharge status: Home / Self Care | Payer: 59 | Attending: Family Medicine | Admitting: Family Medicine

## 2017-12-16 ENCOUNTER — Other Ambulatory Visit: Payer: Self-pay

## 2017-12-16 DIAGNOSIS — M25561 Pain in right knee: Secondary | ICD-10-CM

## 2017-12-16 DIAGNOSIS — M25511 Pain in right shoulder: Secondary | ICD-10-CM | POA: Diagnosis not present

## 2017-12-16 DIAGNOSIS — S0181XA Laceration without foreign body of other part of head, initial encounter: Secondary | ICD-10-CM | POA: Diagnosis not present

## 2017-12-16 DIAGNOSIS — W2209XA Striking against other stationary object, initial encounter: Secondary | ICD-10-CM

## 2017-12-16 DIAGNOSIS — R51 Headache: Secondary | ICD-10-CM | POA: Diagnosis not present

## 2017-12-16 MED ORDER — MUPIROCIN 2 % EX OINT: 1.0000 "application " | TOPICAL_OINTMENT | Freq: Two times a day (BID) | CUTANEOUS | 0 refills | Status: DC
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
## 2017-12-16 NOTE — Discharge Instructions (Signed)
Suture removal in 7-10 days.  Take care  Dr. Lacinda Axon
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
## 2017-12-16 NOTE — ED Notes (Signed)
Wound irrigated with NS and cleansed with Hibiclens

## 2018-03-27 ENCOUNTER — Other Ambulatory Visit: Payer: Self-pay | Admitting: Family

## 2018-03-27 ENCOUNTER — Other Ambulatory Visit: Payer: Self-pay | Admitting: Obstetrics & Gynecology

## 2018-03-27 DIAGNOSIS — Z1231 Encounter for screening mammogram for malignant neoplasm of breast: Secondary | ICD-10-CM

## 2018-04-12 ENCOUNTER — Ambulatory Visit
Admission: RE | Admit: 2018-04-12 | Discharge: 2018-04-12 | Disposition: A | Discharge status: Home / Self Care | Payer: 59 | Source: Ambulatory Visit | Attending: Family | Admitting: Family

## 2018-04-12 DIAGNOSIS — Z1231 Encounter for screening mammogram for malignant neoplasm of breast: Secondary | ICD-10-CM | POA: Insufficient documentation

## 2018-06-10 ENCOUNTER — Ambulatory Visit (INDEPENDENT_AMBULATORY_CARE_PROVIDER_SITE_OTHER): Payer: 59 | Admitting: Family

## 2018-06-10 ENCOUNTER — Encounter: Payer: Self-pay | Admitting: Family

## 2018-06-10 VITALS — BP 122/70 | HR 70 | Temp 98.2°F | Ht 63.0 in | Wt 129.2 lb

## 2018-06-10 DIAGNOSIS — J069 Acute upper respiratory infection, unspecified: Secondary | ICD-10-CM | POA: Insufficient documentation

## 2018-06-10 DIAGNOSIS — Z0000 Encounter for general adult medical examination without abnormal findings: Secondary | ICD-10-CM

## 2018-06-10 DIAGNOSIS — F419 Anxiety disorder, unspecified: Secondary | ICD-10-CM

## 2018-06-10 MED ORDER — LORAZEPAM 0.5 MG PO TABS: 0.5000 mg | ORAL_TABLET | Freq: Two times a day (BID) | ORAL | 1 refills | Status: DC | PRN

## 2018-06-10 MED ORDER — AZITHROMYCIN 250 MG PO TABS: ORAL_TABLET | ORAL | 0 refills | Status: DC

## 2018-06-10 NOTE — Patient Instructions (Signed)
Ensure pap with Dr Leonides Schanz this year  Pleasure seeing you!  Let me know if cough doesn't resolve   Health Maintenance for Postmenopausal Women Menopause is a normal process in which your reproductive ability comes to an end. This process happens gradually over a span of months to years, usually between the ages of 32 and 75. Menopause is complete when you have missed 12 consecutive menstrual periods. It is important to talk with your health care provider about some of the most common conditions that affect postmenopausal women, such as heart disease, cancer, and bone loss (osteoporosis). Adopting a healthy lifestyle and getting preventive care can help to promote your health and wellness. Those actions can also lower your chances of developing some of these common conditions. What should I know about menopause? During menopause, you may experience a number of symptoms, such as:  Moderate-to-severe hot flashes.  Night sweats.  Decrease in sex drive.  Mood swings.  Headaches.  Tiredness.  Irritability.  Memory problems.  Insomnia.  Choosing to treat or not to treat menopausal changes is an individual decision that you make with your health care provider. What should I know about hormone replacement therapy and supplements? Hormone therapy products are effective for treating symptoms that are associated with menopause, such as hot flashes and night sweats. Hormone replacement carries certain risks, especially as you become older. If you are thinking about using estrogen or estrogen with progestin treatments, discuss the benefits and risks with your health care provider. What should I know about heart disease and stroke? Heart disease, heart attack, and stroke become more likely as you age. This may be due, in part, to the hormonal changes that your body experiences during menopause. These can affect how your body processes dietary fats, triglycerides, and cholesterol. Heart attack and  stroke are both medical emergencies. There are many things that you can do to help prevent heart disease and stroke:  Have your blood pressure checked at least every 1-2 years. High blood pressure causes heart disease and increases the risk of stroke.  If you are 42-83 years old, ask your health care provider if you should take aspirin to prevent a heart attack or a stroke.  Do not use any tobacco products, including cigarettes, chewing tobacco, or electronic cigarettes. If you need help quitting, ask your health care provider.  It is important to eat a healthy diet and maintain a healthy weight. ? Be sure to include plenty of vegetables, fruits, low-fat dairy products, and lean protein. ? Avoid eating foods that are high in solid fats, added sugars, or salt (sodium).  Get regular exercise. This is one of the most important things that you can do for your health. ? Try to exercise for at least 150 minutes each week. The type of exercise that you do should increase your heart rate and make you sweat. This is known as moderate-intensity exercise. ? Try to do strengthening exercises at least twice each week. Do these in addition to the moderate-intensity exercise.  Know your numbers.Ask your health care provider to check your cholesterol and your blood glucose. Continue to have your blood tested as directed by your health care provider.  What should I know about cancer screening? There are several types of cancer. Take the following steps to reduce your risk and to catch any cancer development as early as possible. Breast Cancer  Practice breast self-awareness. ? This means understanding how your breasts normally appear and feel. ? It also means doing  regular breast self-exams. Let your health care provider know about any changes, no matter how small.  If you are 34 or older, have a clinician do a breast exam (clinical breast exam or CBE) every year. Depending on your age, family history,  and medical history, it may be recommended that you also have a yearly breast X-ray (mammogram).  If you have a family history of breast cancer, talk with your health care provider about genetic screening.  If you are at high risk for breast cancer, talk with your health care provider about having an MRI and a mammogram every year.  Breast cancer (BRCA) gene test is recommended for women who have family members with BRCA-related cancers. Results of the assessment will determine the need for genetic counseling and BRCA1 and for BRCA2 testing. BRCA-related cancers include these types: ? Breast. This occurs in males or females. ? Ovarian. ? Tubal. This may also be called fallopian tube cancer. ? Cancer of the abdominal or pelvic lining (peritoneal cancer). ? Prostate. ? Pancreatic.  Cervical, Uterine, and Ovarian Cancer Your health care provider may recommend that you be screened regularly for cancer of the pelvic organs. These include your ovaries, uterus, and vagina. This screening involves a pelvic exam, which includes checking for microscopic changes to the surface of your cervix (Pap test).  For women ages 21-65, health care providers may recommend a pelvic exam and a Pap test every three years. For women ages 38-65, they may recommend the Pap test and pelvic exam, combined with testing for human papilloma virus (HPV), every five years. Some types of HPV increase your risk of cervical cancer. Testing for HPV may also be done on women of any age who have unclear Pap test results.  Other health care providers may not recommend any screening for nonpregnant women who are considered low risk for pelvic cancer and have no symptoms. Ask your health care provider if a screening pelvic exam is right for you.  If you have had past treatment for cervical cancer or a condition that could lead to cancer, you need Pap tests and screening for cancer for at least 20 years after your treatment. If Pap tests  have been discontinued for you, your risk factors (such as having a new sexual partner) need to be reassessed to determine if you should start having screenings again. Some women have medical problems that increase the chance of getting cervical cancer. In these cases, your health care provider may recommend that you have screening and Pap tests more often.  If you have a family history of uterine cancer or ovarian cancer, talk with your health care provider about genetic screening.  If you have vaginal bleeding after reaching menopause, tell your health care provider.  There are currently no reliable tests available to screen for ovarian cancer.  Lung Cancer Lung cancer screening is recommended for adults 52-93 years old who are at high risk for lung cancer because of a history of smoking. A yearly low-dose CT scan of the lungs is recommended if you:  Currently smoke.  Have a history of at least 30 pack-years of smoking and you currently smoke or have quit within the past 15 years. A pack-year is smoking an average of one pack of cigarettes per day for one year.  Yearly screening should:  Continue until it has been 15 years since you quit.  Stop if you develop a health problem that would prevent you from having lung cancer treatment.  Colorectal Cancer  This type of cancer can be detected and can often be prevented.  Routine colorectal cancer screening usually begins at age 52 and continues through age 34.  If you have risk factors for colon cancer, your health care provider may recommend that you be screened at an earlier age.  If you have a family history of colorectal cancer, talk with your health care provider about genetic screening.  Your health care provider may also recommend using home test kits to check for hidden blood in your stool.  A small camera at the end of a tube can be used to examine your colon directly (sigmoidoscopy or colonoscopy). This is done to check for  the earliest forms of colorectal cancer.  Direct examination of the colon should be repeated every 5-10 years until age 56. However, if early forms of precancerous polyps or small growths are found or if you have a family history or genetic risk for colorectal cancer, you may need to be screened more often.  Skin Cancer  Check your skin from head to toe regularly.  Monitor any moles. Be sure to tell your health care provider: ? About any new moles or changes in moles, especially if there is a change in a mole's shape or color. ? If you have a mole that is larger than the size of a pencil eraser.  If any of your family members has a history of skin cancer, especially at a young age, talk with your health care provider about genetic screening.  Always use sunscreen. Apply sunscreen liberally and repeatedly throughout the day.  Whenever you are outside, protect yourself by wearing long sleeves, pants, a wide-brimmed hat, and sunglasses.  What should I know about osteoporosis? Osteoporosis is a condition in which bone destruction happens more quickly than new bone creation. After menopause, you may be at an increased risk for osteoporosis. To help prevent osteoporosis or the bone fractures that can happen because of osteoporosis, the following is recommended:  If you are 81-17 years old, get at least 1,000 mg of calcium and at least 600 mg of vitamin D per day.  If you are older than age 48 but younger than age 8, get at least 1,200 mg of calcium and at least 600 mg of vitamin D per day.  If you are older than age 75, get at least 1,200 mg of calcium and at least 800 mg of vitamin D per day.  Smoking and excessive alcohol intake increase the risk of osteoporosis. Eat foods that are rich in calcium and vitamin D, and do weight-bearing exercises several times each week as directed by your health care provider. What should I know about how menopause affects my mental health? Depression may  occur at any age, but it is more common as you become older. Common symptoms of depression include:  Low or sad mood.  Changes in sleep patterns.  Changes in appetite or eating patterns.  Feeling an overall lack of motivation or enjoyment of activities that you previously enjoyed.  Frequent crying spells.  Talk with your health care provider if you think that you are experiencing depression. What should I know about immunizations? It is important that you get and maintain your immunizations. These include:  Tetanus, diphtheria, and pertussis (Tdap) booster vaccine.  Influenza every year before the flu season begins.  Pneumonia vaccine.  Shingles vaccine.  Your health care provider may also recommend other immunizations. This information is not intended to replace advice given to you by  your health care provider. Make sure you discuss any questions you have with your health care provider. Document Released: 02/02/2006 Document Revised: 06/30/2016 Document Reviewed: 09/14/2015 Elsevier Interactive Patient Education  2018 [employer]  

## 2018-06-10 NOTE — Assessment & Plan Note (Addendum)
Patient well-appearing.  No acute respiratory distress.  SaO2 96%.  Based on duration of symptoms, we jointly agreed it is reasonable start antibiotic therapy at this time.  Patient let me know if no improvement.

## 2018-06-10 NOTE — Progress Notes (Signed)
Subjective:    Patient ID: Rachael Graham, female    DOB: 1951-04-01, 67 y.o.   MRN: [id number]  CC: Rachael Graham is a 67 y.o. female who presents today for physical exam.    HPI: Overall feeling well today.  She does report that she is had a productive cough for 3 weeks, unchanged.  She is been using Mucinex at home.  Denies any shortness of breath, wheezing, fever, sinus pain, ear pain.  Continues to use Xanax very sparingly.  Especially for travel ; she has some upcoming trips.  She would like a refill.   Colorectal Cancer Screening: UTD  Breast Cancer Screening: Mammogram UTD Cervical Cancer Screening: due; follows with Dr Leonides Schanz [facility] screening/DEXA for 65+: Normal 2017.  Lung Cancer Screening: Doesn't have 30 year pack year history and age > 54 years. Immunizations       Tetanus - utd        Labs: Screening labs today. Exercise: Gets regular exercise.  Alcohol use:  Smoking/tobacco use: Nonsmoker.  Regular dental exams: UTD Wears seat belt: Yes. Skin: no new skin lesions  HISTORY:  Past Medical History:  Diagnosis Date  . Arthritis    knee, s/p steroid injection Dr. Alvan Dame  . Chicken pox   . Colon polyp   . Trigger finger    Followed at [facility], Dr. Veronia Beets    Past Surgical History:  Procedure Laterality Date  . AUGMENTATION MAMMAPLASTY Bilateral 1988  . BREAST BIOPSY     bilateral, both benign  . KNEE ARTHROSCOPY    . TONSILLECTOMY AND ADENOIDECTOMY  1964  . TRIGGER FINGER RELEASE     Dr. Veronia Beets  . TUBAL LIGATION    . VAGINAL DELIVERY     Family History  Problem Relation Age of Onset  . Arthritis Mother   . Hyperlipidemia Mother   . Heart disease Mother   . Hypertension Mother   . Stroke Mother   . Arthritis Father   . Hyperlipidemia Father   . Heart disease Father   . Hypertension Father   . Parkinsonism Father   . Crohn's disease Sister   . Obesity Sister   . Arthritis Maternal Grandmother   . Cancer Maternal Grandmother        colon  . Arthritis Maternal Grandfather   . Cancer Maternal Grandfather        colon  . Breast cancer Neg Hx       ALLERGIES: Patient has no known allergies.  No current outpatient medications on file prior to visit.   No current facility-administered medications on file prior to visit.     Social History   Tobacco Use  . Smoking status: Never Smoker  . Smokeless tobacco: Never Used  Substance Use Topics  . Alcohol use: Yes    Comment: occaisionaly  . Drug use: No    Review of Systems  Constitutional: Negative for chills, fever and unexpected weight change.  HENT: Positive for congestion.   Respiratory: Positive for cough. Negative for shortness of breath and wheezing.   Cardiovascular: Negative for chest pain, palpitations and leg swelling.  Gastrointestinal: Negative for nausea and vomiting.  Musculoskeletal: Negative for arthralgias and myalgias.  Skin: Negative for rash.  Neurological: Negative for headaches.  Hematological: Negative for adenopathy.  Psychiatric/Behavioral: Negative for confusion.      Objective:    BP 122/70 (BP Location: Left Arm, Patient Position: Sitting, Cuff Size: Normal)   Pulse 70   Temp 98.2 F (36.8 C)  Wt 129 lb 4 oz (58.6 kg)   SpO2 96%   BMI 22.90 kg/m   BP Readings from Last 3 Encounters:  06/10/18 122/70  12/16/17 (!) 176/89  08/06/17 126/70   Wt Readings from Last 3 Encounters:  06/10/18 129 lb 4 oz (58.6 kg)  01/01/17 122 lb (55.3 kg)  10/06/15 127 lb 8 oz (57.8 kg)    Physical Exam  Constitutional: She appears well-developed and well-nourished.  HENT:  Head: Normocephalic and atraumatic.  Right Ear: Hearing, tympanic membrane, external ear and ear canal normal. No drainage, swelling or tenderness. No foreign bodies. Tympanic membrane is not erythematous and not bulging. No middle ear effusion. No decreased hearing is noted.  Left Ear: Hearing, tympanic membrane, external ear and ear canal normal. No  drainage, swelling or tenderness. No foreign bodies. Tympanic membrane is not erythematous and not bulging.  No middle ear effusion. No decreased hearing is noted.  Nose: Nose normal. No rhinorrhea. Right sinus exhibits no maxillary sinus tenderness and no frontal sinus tenderness. Left sinus exhibits no maxillary sinus tenderness and no frontal sinus tenderness.  Mouth/Throat: Uvula is midline, oropharynx is clear and moist and mucous membranes are normal. No oropharyngeal exudate, posterior oropharyngeal edema, posterior oropharyngeal erythema or tonsillar abscesses.  Eyes: Conjunctivae are normal.  Cardiovascular: Normal rate, regular rhythm, normal heart sounds and normal pulses.  Pulmonary/Chest: Effort normal and breath sounds normal. She has no wheezes. She has no rhonchi. She has no rales.  Abdominal: Soft. Normal appearance and bowel sounds are normal. She exhibits no distension, no fluid wave, no ascites and no mass. There is no tenderness. There is no rigidity, no rebound, no guarding and no CVA tenderness.  Lymphadenopathy:       Head (right side): No submental, no submandibular, no tonsillar, no preauricular, no posterior auricular and no occipital adenopathy present.       Head (left side): No submental, no submandibular, no tonsillar, no preauricular, no posterior auricular and no occipital adenopathy present.    She has no cervical adenopathy.  Neurological: She is alert.  Skin: Skin is warm and dry.  Psychiatric: She has a normal mood and affect. Her speech is normal and behavior is normal. Thought content normal.  Vitals reviewed.      Assessment & Plan:   Problem List Items Addressed This Visit      Respiratory   Viral upper respiratory tract infection - Primary    Patient well-appearing.  No acute respiratory distress.  SaO2 96%.  Based on duration of symptoms, we jointly agreed it is reasonable start antibiotic therapy at this time.  Patient let me know if no  improvement.      Relevant Medications   azithromycin (ZITHROMAX) 250 MG tablet     Other   Anxiety    Stable. Refilled.  I looked up patient on  Controlled Substances Reporting System and saw no activity that raised concern of inappropriate use.        Relevant Medications   LORazepam (ATIVAN) 0.5 MG tablet   Routine general medical examination at a health care facility    CBE performed.  Declines pelvic exam in the absence of complaints.  She follows with OB/GYN and is due for Pap smear.  She will make this appointment.  Declines repeat of DEXA scan; She will follow this with her OB/GYN as well.  We discussed at great length labs, how they were normal 2018.  Patient and I jointly agreed to defer labs this  year in the absence of complaints.  She will let me know if this were to change.          I have discontinued Izora Gala Craw's mupirocin ointment. I am also having her start on azithromycin. Additionally, I am having her maintain her LORazepam.   Meds ordered this encounter  Medications  . azithromycin (ZITHROMAX) 250 MG tablet    Sig: Tale 500 mg PO on day 1, then 250 mg PO q24h x 4 days.    Dispense:  6 tablet    Refill:  0    Order Specific Question:   Supervising Provider    Answer:   Derrel Nip, TERESA L [[id number]]  . LORazepam (ATIVAN) 0.5 MG tablet    Sig: Take 1 tablet (0.5 mg total) by mouth 2 (two) times daily as needed for anxiety.    Dispense:  60 tablet    Refill:  1    Order Specific Question:   Supervising Provider    Answer:   Crecencio Mc [[id number]]    Return precautions given.   Risks, benefits, and alternatives of the medications and treatment plan prescribed today were discussed, and patient expressed understanding.   Education regarding symptom management and diagnosis given to patient on AVS.   Continue to follow with Burnard Hawthorne, FNP for routine health maintenance.   Rachael Graham and I agreed with plan.   Mable Paris, FNP

## 2018-06-10 NOTE — Assessment & Plan Note (Signed)
CBE performed.  Declines pelvic exam in the absence of complaints.  She follows with OB/GYN and is due for Pap smear.  She will make this appointment.  Declines repeat of DEXA scan; She will follow this with her OB/GYN as well.  We discussed at great length labs, how they were normal 2018.  Patient and I jointly agreed to defer labs this year in the absence of complaints.  She will let me know if this were to change.

## 2018-06-10 NOTE — Assessment & Plan Note (Addendum)
Stable. Refilled.  I looked up patient on [place] Controlled Substances Reporting System and saw no activity that raised concern of inappropriate use.

## 2018-09-17 ENCOUNTER — Ambulatory Visit (INDEPENDENT_AMBULATORY_CARE_PROVIDER_SITE_OTHER): Payer: 59 | Admitting: *Deleted

## 2018-09-17 DIAGNOSIS — Z23 Encounter for immunization: Secondary | ICD-10-CM | POA: Diagnosis not present

## 2018-12-06 DIAGNOSIS — H2513 Age-related nuclear cataract, bilateral: Secondary | ICD-10-CM | POA: Diagnosis not present

## 2019-04-25 ENCOUNTER — Ambulatory Visit (INDEPENDENT_AMBULATORY_CARE_PROVIDER_SITE_OTHER): Payer: 59 | Admitting: Family

## 2019-04-25 ENCOUNTER — Encounter: Payer: Self-pay | Admitting: Family

## 2019-04-25 ENCOUNTER — Other Ambulatory Visit: Payer: Self-pay

## 2019-04-25 DIAGNOSIS — K219 Gastro-esophageal reflux disease without esophagitis: Secondary | ICD-10-CM | POA: Diagnosis not present

## 2019-04-25 NOTE — Assessment & Plan Note (Deleted)
Well controlled. Using xanax prn.

## 2019-04-25 NOTE — Progress Notes (Signed)
This visit type was conducted due to national recommendations for restrictions regarding the COVID-19 pandemic (e.g. social distancing).  This format is felt to be most appropriate for this patient at this time.  All issues noted in this document were discussed and addressed.  No physical exam was performed (except for noted visual exam findings with Video Visits). Virtual Visit via Video Note  I connected with@  on 04/28/19 at  3:00 PM EDT by a video enabled telemedicine application and verified that I am speaking with the correct person using two identifiers.  Location patient: home Location provider:work  Persons participating in the virtual visit: patient, provider  I discussed the limitations of evaluation and management by telemedicine and the availability of in person appointments. The patient expressed understanding and agreed to proceed.   HPI:  Waking up with 'indigestion' and intermittent right upper quadrant pain for  6 days, using a lot of tums with relief. Increased Burping.  Symptoms have improved since started on on omeprazole for past 3 days. RUQ Pain has resolved today.  No new foods.  No fever, vomiting, diarrhea, weight loss. Rare NSAID use. Drinks a beer every once in a while.   Years ago had HIDA scan. Was told gallbladder was slow.    ROS: See pertinent positives and negatives per HPI.  Past Medical History:  Diagnosis Date  . Arthritis    knee, s/p steroid injection Dr. Alvan Dame  . Chicken pox   . Colon polyp   . Trigger finger    Followed at [facility], Dr. Veronia Beets    Past Surgical History:  Procedure Laterality Date  . AUGMENTATION MAMMAPLASTY Bilateral 1988  . BREAST BIOPSY     bilateral, both benign  . KNEE ARTHROSCOPY    . TONSILLECTOMY AND ADENOIDECTOMY  1964  . TRIGGER FINGER RELEASE     Dr. Veronia Beets  . TUBAL LIGATION    . VAGINAL DELIVERY      Family History  Problem Relation Age of Onset  . Arthritis Mother   . Hyperlipidemia  Mother   . Heart disease Mother   . Hypertension Mother   . Stroke Mother   . Arthritis Father   . Hyperlipidemia Father   . Heart disease Father   . Hypertension Father   . Parkinsonism Father   . Crohn's disease Sister   . Obesity Sister   . Arthritis Maternal Grandmother   . Cancer Maternal Grandmother        colon  . Arthritis Maternal Grandfather   . Cancer Maternal Grandfather        colon  . Breast cancer Neg Hx     SOCIAL HX: never smoker   Current Outpatient Medications:  .  calcium carbonate (TUMS - DOSED IN MG ELEMENTAL CALCIUM) 500 MG chewable tablet, Chew 1 tablet by mouth as needed for indigestion or heartburn., Disp: , Rfl:  .  ibuprofen (ADVIL) 100 MG tablet, Take 100 mg by mouth as needed for fever. Take three tablets daily as needed., Disp: , Rfl:  .  omeprazole (PRILOSEC) 20 MG capsule, Take 20 mg by mouth daily., Disp: , Rfl:   EXAM:  VITALS per patient if applicable:  GENERAL: alert, oriented, appears well and in no acute distress  HEENT: atraumatic, conjunttiva clear, no obvious abnormalities on inspection of external nose and ears  NECK: normal movements of the head and neck  LUNGS: on inspection no signs of respiratory distress, breathing rate appears normal, no obvious gross SOB, gasping or wheezing  CV: no obvious cyanosis  MS: moves all visible extremities without noticeable abnormality  PSYCH/NEURO: pleasant and cooperative, no obvious depression or anxiety, speech and thought processing grossly intact  ASSESSMENT AND PLAN:  Discussed the following assessment and plan:  Gastroesophageal reflux disease, esophagitis presence not specified - Plan: Comprehensive metabolic panel, CBC with Differential/Platelet, Lipase, Amylase, US ABDOMEN LIMITED RUQ  Problem List Items Addressed This Visit      Digestive   Gastroesophageal reflux disease - Primary    Discussed with patient the limitations of telemedicine.  Discussed with patient my  working diagnoses of gastritis, GERD, gallbladder etiology.  Patient is nontoxic in appearance today.  Her pain has resolved on PPI.  Education about on long-term use and side effects of PPI.  pending labs, ultrasound.  Patient declines having labs, US done stat today.  She will let me know of any new or worsening symptoms.      Relevant Medications   omeprazole (PRILOSEC) 20 MG capsule   calcium carbonate (TUMS - DOSED IN MG ELEMENTAL CALCIUM) 500 MG chewable tablet   Other Relevant Orders   Comprehensive metabolic panel   CBC with Differential/Platelet   Lipase   Amylase   US ABDOMEN LIMITED RUQ        I discussed the assessment and treatment plan with the patient. The patient was provided an opportunity to ask questions and all were answered. The patient agreed with the plan and demonstrated an understanding of the instructions.   The patient was advised to call back or seek an in-person evaluation if the symptoms worsen or if the condition fails to improve as anticipated.   Mable Paris, FNP

## 2019-04-25 NOTE — Patient Instructions (Addendum)
As discussed, there are limitations certainly with telemedicine however I do have suspicion in regards to gallbladder etiology and also acid reflux.    We will pursue labs, ultrasound.  Please stay vigilant and let me know of any new or worsening features   Long term use beyond 3 months of proton pump inhibitors , also called PPI's, is associated with malabsorption of vitamins, chronic kidney disease, fracture risk, and diarrheal illnesses. PPI's include Nexium, Prilosec, Protonix, Dexilant, and Prevacid.   I generally recommend trying to control acid reflux with lifestyle modifications including avoiding trigger foods, not eating 2 hours prior to bedtime. You may use histamine 2 blockers daily to twice daily ( this is Zantac, Pepcid) and then when symptoms flare, start back on PPI for short course.   Of note, we will need to do an endoscopy ( upper GI) to evaluate your esophagus, stomach in the future if acid reflux persists are you develop red flag symptoms: trouble swallowing, hoarseness, chronic cough, unexplained weight loss.     Food Choices for Gastroesophageal Reflux Disease, Adult When you have gastroesophageal reflux disease (GERD), the foods you eat and your eating habits are very important. Choosing the right foods can help ease your discomfort. Think about working with a nutrition specialist (dietitian) to help you make good choices. What are tips for following this plan?  Meals  Choose healthy foods that are low in fat, such as fruits, vegetables, whole grains, low-fat dairy products, and lean meat, fish, and poultry.  Eat small meals often instead of 3 large meals a day. Eat your meals slowly, and in a place where you are relaxed. Avoid bending over or lying down until 2-3 hours after eating.  Avoid eating meals 2-3 hours before bed.  Avoid drinking a lot of liquid with meals.  Cook foods using methods other than frying. Bake, grill, or broil food instead.  Avoid or  limit: ? Chocolate. ? Peppermint or spearmint. ? Alcohol. ? Pepper. ? Black and decaffeinated coffee. ? Black and decaffeinated tea. ? Bubbly (carbonated) soft drinks. ? Caffeinated energy drinks and soft drinks.  Limit high-fat foods such as: ? Fatty meat or fried foods. ? Whole milk, cream, butter, or ice cream. ? Nuts and nut butters. ? Pastries, donuts, and sweets made with butter or shortening.  Avoid foods that cause symptoms. These foods may be different for everyone. Common foods that cause symptoms include: ? Tomatoes. ? Oranges, lemons, and limes. ? Peppers. ? Spicy food. ? Onions and garlic. ? Vinegar. Lifestyle  Maintain a healthy weight. Ask your doctor what weight is healthy for you. If you need to lose weight, work with your doctor to do so safely.  Exercise for at least 30 minutes for 5 or more days each week, or as told by your doctor.  Wear loose-fitting clothes.  Do not smoke. If you need help quitting, ask your doctor.  Sleep with the head of your bed higher than your feet. Use a wedge under the mattress or blocks under the bed frame to raise the head of the bed. Summary  When you have gastroesophageal reflux disease (GERD), food and lifestyle choices are very important in easing your symptoms.  Eat small meals often instead of 3 large meals a day. Eat your meals slowly, and in a place where you are relaxed.  Limit high-fat foods such as fatty meat or fried foods.  Avoid bending over or lying down until 2-3 hours after eating.  Avoid peppermint and  spearmint, caffeine, alcohol, and chocolate. This information is not intended to replace advice given to you by your health care provider. Make sure you discuss any questions you have with your health care provider. Document Released: 06/11/2012 Document Revised: 01/16/2017 Document Reviewed: 01/16/2017 Elsevier Interactive Patient Education  2019 [employer].

## 2019-04-28 NOTE — Assessment & Plan Note (Addendum)
Discussed with patient the limitations of telemedicine.  Discussed with patient my working diagnoses of gastritis, GERD, gallbladder etiology.  Patient is nontoxic in appearance today.  Her pain has resolved on PPI.  Education about on long-term use and side effects of PPI.  pending labs, ultrasound.  Patient declines having labs, US done stat today.  She will let me know of any new or worsening symptoms.

## 2019-04-29 ENCOUNTER — Telehealth: Payer: Self-pay

## 2019-04-29 NOTE — Telephone Encounter (Signed)
Copied from [place] [id number]. Topic: General - Other >> Apr 28, 2019  3:55 PM Virl Axe D wrote: Reason for CRM: Pt stated she was scheduled for an abdominal US. Her symptoms are better, and she has decided to postpone due to a Copay of $600. She is still taking Prilosec. If she starts having symptoms again she will contact office. Please advise.

## 2019-04-30 ENCOUNTER — Ambulatory Visit: Payer: 59

## 2019-04-30 NOTE — Telephone Encounter (Signed)
noted 

## 2019-05-01 ENCOUNTER — Other Ambulatory Visit (INDEPENDENT_AMBULATORY_CARE_PROVIDER_SITE_OTHER): Payer: 59

## 2019-05-01 ENCOUNTER — Other Ambulatory Visit: Payer: Self-pay

## 2019-05-01 DIAGNOSIS — K219 Gastro-esophageal reflux disease without esophagitis: Secondary | ICD-10-CM

## 2019-05-01 DIAGNOSIS — R17 Unspecified jaundice: Secondary | ICD-10-CM

## 2019-05-01 DIAGNOSIS — R748 Abnormal levels of other serum enzymes: Secondary | ICD-10-CM

## 2019-05-01 LAB — CBC WITH DIFFERENTIAL/PLATELET
Basophils Absolute: 0.1 10*3/uL (ref 0.0–0.1)
Basophils Relative: 1.2 % (ref 0.0–3.0)
Eosinophils Absolute: 0.1 10*3/uL (ref 0.0–0.7)
Eosinophils Relative: 2.1 % (ref 0.0–5.0)
HCT: 39.8 % (ref 36.0–46.0)
Hemoglobin: 13.6 g/dL (ref 12.0–15.0)
Lymphocytes Relative: 43.4 % (ref 12.0–46.0)
Lymphs Abs: 2.7 10*3/uL (ref 0.7–4.0)
MCHC: 34.2 g/dL (ref 30.0–36.0)
MCV: 87.2 fl (ref 78.0–100.0)
Monocytes Absolute: 0.6 10*3/uL (ref 0.1–1.0)
Monocytes Relative: 9.9 % (ref 3.0–12.0)
Neutro Abs: 2.7 10*3/uL (ref 1.4–7.7)
Neutrophils Relative %: 43.4 % (ref 43.0–77.0)
Platelets: 249 10*3/uL (ref 150.0–400.0)
RBC: 4.57 Mil/uL (ref 3.87–5.11)
RDW: 13.1 % (ref 11.5–15.5)
WBC: 6.2 10*3/uL (ref 4.0–10.5)

## 2019-05-01 LAB — COMPREHENSIVE METABOLIC PANEL
ALT: 15 U/L (ref 0–35)
AST: 18 U/L (ref 0–37)
Albumin: 4.4 g/dL (ref 3.5–5.2)
Alkaline Phosphatase: 73 U/L (ref 39–117)
BUN: 13 mg/dL (ref 6–23)
CO2: 32 mEq/L (ref 19–32)
Calcium: 9.4 mg/dL (ref 8.4–10.5)
Chloride: 102 mEq/L (ref 96–112)
Creatinine, Ser: 0.69 mg/dL (ref 0.40–1.20)
GFR: 85.25 mL/min (ref >60.00)
Glucose, Bld: 88 mg/dL (ref 70–99)
Potassium: 4.5 mEq/L (ref 3.5–5.1)
Sodium: 140 mEq/L (ref 135–145)
Total Bilirubin: 1.5 mg/dL — ABNORMAL HIGH (ref 0.2–1.2)
Total Protein: 6.8 g/dL (ref 6.0–8.3)

## 2019-05-01 LAB — LIPASE: Lipase: 275 U/L — ABNORMAL HIGH (ref 11.0–59.0)

## 2019-05-01 LAB — AMYLASE: Amylase: 153 U/L — ABNORMAL HIGH (ref 27–131)

## 2019-05-01 NOTE — Addendum Note (Signed)
Addended by: Philis Nettle on: 05/01/2019 02:37 PM   Modules accepted: Orders

## 2019-05-01 NOTE — Addendum Note (Signed)
Addended by: Nanci Pina on: 05/01/2019 02:12 PM   Modules accepted: Orders

## 2019-05-01 NOTE — Progress Notes (Signed)
Labs ordered.

## 2019-05-02 ENCOUNTER — Other Ambulatory Visit: Payer: Self-pay | Admitting: Family

## 2019-05-02 ENCOUNTER — Other Ambulatory Visit (INDEPENDENT_AMBULATORY_CARE_PROVIDER_SITE_OTHER): Payer: 59

## 2019-05-02 DIAGNOSIS — R17 Unspecified jaundice: Secondary | ICD-10-CM | POA: Diagnosis not present

## 2019-05-02 DIAGNOSIS — R748 Abnormal levels of other serum enzymes: Secondary | ICD-10-CM | POA: Diagnosis not present

## 2019-05-02 LAB — CBC WITH DIFFERENTIAL/PLATELET
Basophils Absolute: 0.1 10*3/uL (ref 0.0–0.1)
Basophils Relative: 0.8 % (ref 0.0–3.0)
Eosinophils Absolute: 0.1 10*3/uL (ref 0.0–0.7)
Eosinophils Relative: 1.9 % (ref 0.0–5.0)
HCT: 39.9 % (ref 36.0–46.0)
Hemoglobin: 13.5 g/dL (ref 12.0–15.0)
Lymphocytes Relative: 42 % (ref 12.0–46.0)
Lymphs Abs: 2.9 10*3/uL (ref 0.7–4.0)
MCHC: 33.9 g/dL (ref 30.0–36.0)
MCV: 87.4 fl (ref 78.0–100.0)
Monocytes Absolute: 0.6 10*3/uL (ref 0.1–1.0)
Monocytes Relative: 9 % (ref 3.0–12.0)
Neutro Abs: 3.2 10*3/uL (ref 1.4–7.7)
Neutrophils Relative %: 46.3 % (ref 43.0–77.0)
Platelets: 254 10*3/uL (ref 150.0–400.0)
RBC: 4.56 Mil/uL (ref 3.87–5.11)
RDW: 13 % (ref 11.5–15.5)
WBC: 7 10*3/uL (ref 4.0–10.5)

## 2019-05-02 LAB — HEPATIC FUNCTION PANEL
ALT: 14 U/L (ref 0–35)
AST: 17 U/L (ref 0–37)
Albumin: 4.6 g/dL (ref 3.5–5.2)
Alkaline Phosphatase: 72 U/L (ref 39–117)
Bilirubin, Direct: 0.2 mg/dL (ref 0.0–0.3)
Total Bilirubin: 1.6 mg/dL — ABNORMAL HIGH (ref 0.2–1.2)
Total Protein: 7 g/dL (ref 6.0–8.3)

## 2019-05-02 LAB — BASIC METABOLIC PANEL
BUN: 16 mg/dL (ref 6–23)
CO2: 32 mEq/L (ref 19–32)
Calcium: 9.3 mg/dL (ref 8.4–10.5)
Chloride: 103 mEq/L (ref 96–112)
Creatinine, Ser: 0.67 mg/dL (ref 0.40–1.20)
GFR: 88.19 mL/min (ref >60.00)
Glucose, Bld: 88 mg/dL (ref 70–99)
Potassium: 4.4 mEq/L (ref 3.5–5.1)
Sodium: 142 mEq/L (ref 135–145)

## 2019-05-02 LAB — LIPASE: Lipase: 35 U/L (ref 11.0–59.0)

## 2019-05-02 LAB — AMYLASE: Amylase: 56 U/L (ref 27–131)

## 2019-05-05 ENCOUNTER — Other Ambulatory Visit: Payer: Self-pay | Admitting: Family

## 2019-05-05 DIAGNOSIS — R17 Unspecified jaundice: Secondary | ICD-10-CM | POA: Insufficient documentation

## 2019-05-06 ENCOUNTER — Telehealth: Payer: Self-pay

## 2019-05-06 NOTE — Telephone Encounter (Signed)
Copied from [place] [id number]. Topic: Referral - Status >> May 06, 2019  9:36 AM Valla Leaver wrote: Reason for CRM: Patient wants the GI referral placed yesterday to be sent to Dr. Lucilla Lame  [phone]. He is a [employer]. Patient would like a call once completed.

## 2019-05-07 ENCOUNTER — Encounter: Payer: Self-pay | Admitting: Family

## 2019-05-07 NOTE — Telephone Encounter (Signed)
Noted Melissa aware

## 2019-05-13 ENCOUNTER — Other Ambulatory Visit: Payer: Self-pay

## 2019-05-13 ENCOUNTER — Encounter: Payer: Self-pay | Admitting: Gastroenterology

## 2019-05-13 ENCOUNTER — Ambulatory Visit: Payer: 59 | Admitting: Gastroenterology

## 2019-05-13 VITALS — BP 132/70 | HR 82 | Temp 98.1°F | Ht 63.0 in | Wt 130.4 lb

## 2019-05-13 DIAGNOSIS — R1013 Epigastric pain: Secondary | ICD-10-CM | POA: Diagnosis not present

## 2019-05-13 NOTE — Progress Notes (Signed)
Gastroenterology Consultation  Referring Provider:     Burnard Hawthorne, FNP Primary Care Physician:  Burnard Hawthorne, FNP Primary Gastroenterologist:  Dr. Allen Norris     Reason for Consultation:     Abdominal pain and abnormal lipase        HPI:   Rachael Graham is a 68 y.o. y/o female referred for consultation & management of abdominal pain and abnormal lipase by Dr. Vidal Schwalbe, Yvetta Coder, FNP.  This patient comes in today with a history of abdominal pain that first came on about 10 years ago that she reported was extremely painful and nothing like the episode she had recently.  The most recent episode of abdominal pain was in the epigastric area and she reports is going up to her chest with burning in her ears and throat.  The patient was started on a PPI and reports that her symptoms improved.  The patient also had lab work sent off that showed her lipase to be elevated at 257 with a lipase of 153.  The following day the patient had a lipase repeated that came down to 56.  She was recommended to undergo an ultrasound but her co-pay was $600 so she deferred having that done.  The patient has had a chronic elevation of her bilirubin with the direct being normal and the elevation being indirect consistent with Gilbert's disease.  The patient had a CBC that was normal. The patient reports that she is completely asymptomatic now and is doing very well without any abdominal pain nausea vomiting fevers or chills.  When she did have the pain in the epigastric area she does report that it radiated to her back.  There is no history of any black stools or bloody stools. The patient also reports that she is not due for colonoscopy at the present time but her last colonoscopy in [place] was very painful with a lot of gas and she is very hesitant to ever undergo a colonoscopy again.  She states that she may elect to have a Cologuard when it is time to have her colonoscopy.  Past Medical History:  Diagnosis Date   . Arthritis    knee, s/p steroid injection Dr. Alvan Dame  . Chicken pox   . Colon polyp   . Trigger finger    Followed at [facility], Dr. Veronia Beets    Past Surgical History:  Procedure Laterality Date  . AUGMENTATION MAMMAPLASTY Bilateral 1988  . BREAST BIOPSY     bilateral, both benign  . KNEE ARTHROSCOPY    . TONSILLECTOMY AND ADENOIDECTOMY  1964  . TRIGGER FINGER RELEASE     Dr. Veronia Beets  . TUBAL LIGATION    . VAGINAL DELIVERY      Prior to Admission medications   Medication Sig Start Date End Date Taking? Authorizing Provider  calcium carbonate (TUMS - DOSED IN MG ELEMENTAL CALCIUM) 500 MG chewable tablet Chew 1 tablet by mouth as needed for indigestion or heartburn.   Yes [provider]  ibuprofen (ADVIL) 100 MG tablet Take 100 mg by mouth as needed for fever. Take three tablets daily as needed.   Yes [provider]  omeprazole (PRILOSEC) 20 MG capsule Take 20 mg by mouth daily.   Yes [provider]    Family History  Problem Relation Age of Onset  . Arthritis Mother   . Hyperlipidemia Mother   . Heart disease Mother   . Hypertension Mother   . Stroke Mother   .  Arthritis Father   . Hyperlipidemia Father   . Heart disease Father   . Hypertension Father   . Parkinsonism Father   . Crohn's disease Sister   . Obesity Sister   . Arthritis Maternal Grandmother   . Cancer Maternal Grandmother        colon  . Arthritis Maternal Grandfather   . Cancer Maternal Grandfather        colon  . Breast cancer Neg Hx      Social History   Tobacco Use  . Smoking status: Never Smoker  . Smokeless tobacco: Never Used  Substance Use Topics  . Alcohol use: Yes    Comment: occaisionaly  . Drug use: No    Allergies as of 05/13/2019  . (No Known Allergies)    Review of Systems:    All systems reviewed and negative except where noted in HPI.   Physical Exam:  BP 132/70   Pulse 82   Temp 98.1 F (36.7 C) (Oral)   Ht 5\' 3"  (1.6 m)    Wt 130 lb 6.4 oz (59.1 kg)   BMI 23.10 kg/m  No LMP recorded. Patient is postmenopausal. General:   Alert,  Well-developed, well-nourished, pleasant and cooperative in NAD Head:  Normocephalic and atraumatic. Eyes:  Sclera clear, no icterus.   Conjunctiva pink. Ears:  Normal auditory acuity. Nose:  No deformity, discharge, or lesions. Neck:  Supple; no masses or thyromegaly. Lungs:  Respirations even and unlabored.  Clear throughout to auscultation.   No wheezes, crackles, or rhonchi. No acute distress. Heart:  Regular rate and rhythm; no murmurs, clicks, rubs, or gallops. Abdomen:  Normal bowel sounds.  No bruits.  Soft, non-tender and non-distended without masses, hepatosplenomegaly or hernias noted.  No guarding or rebound tenderness.  Negative Carnett sign.   Rectal:  Deferred.  Msk:  Symmetrical without gross deformities.  Good, equal movement & strength bilaterally. Pulses:  Normal pulses noted. Extremities:  No clubbing or edema.  No cyanosis. Neurologic:  Alert and oriented x3;  grossly normal neurologically. Skin:  Intact without significant lesions or rashes.  No jaundice. Lymph Nodes:  No significant cervical adenopathy. Psych:  Alert and cooperative. Normal mood and affect.  Imaging Studies: No results found.  Assessment and Plan:   Rachael Graham is a 68 y.o. y/o female who comes in with a recent history of abdominal pain with abnormal amylase and lipase that quickly came back to normal.  Although this may represent pancreatitis there is no imaging to confirm that.  The patient may have had a gallstone that caused her to have pancreatitis and passed a gallstone versus the possibility that the stone is acting like a ball and valve.  The patient's elevated bilirubin is predominantly indirect and consistent with Gilbert's disease.  No further work-up of her abnormal bilirubin is needed and it has been elevated chronically for some time.  The patient has been told that if she has  the symptoms again then she should contact my office or her PCP to get urgent labs to see if her LFTs increase or her lipase goes back up.  If that does happen then further investigation with a possible MRCP will be needed to rule out a bile duct stone and the patient may need a cholecystectomy at that time.  I have discussed the risks of further episodes of pancreatitis and the possibility that this may be microlithiasis as the cause of her pancreatitis.  At this point we have discussed that this  is a single episode/occurrence and not a pattern so therefore we will see if she has any further symptoms and will address it at that time. The patient has also been told to continue the prilosec for 6 weeks and try to stop it and see if the symptoms return.  The patient has been explained the plan and agrees with it.  Lucilla Lame, MD. Marval Regal    Note: This dictation was prepared with Dragon dictation along with smaller phrase technology. Any transcriptional errors that result from this process are unintentional.

## 2019-05-14 ENCOUNTER — Ambulatory Visit: Payer: 59 | Admitting: Gastroenterology

## 2019-08-26 ENCOUNTER — Ambulatory Visit (INDEPENDENT_AMBULATORY_CARE_PROVIDER_SITE_OTHER): Payer: Medicare HMO

## 2019-08-26 ENCOUNTER — Other Ambulatory Visit: Payer: Self-pay

## 2019-08-26 DIAGNOSIS — Z23 Encounter for immunization: Secondary | ICD-10-CM

## 2019-09-19 ENCOUNTER — Other Ambulatory Visit: Payer: Self-pay | Admitting: Obstetrics & Gynecology

## 2019-09-19 DIAGNOSIS — Z1231 Encounter for screening mammogram for malignant neoplasm of breast: Secondary | ICD-10-CM

## 2019-09-20 DIAGNOSIS — R351 Nocturia: Secondary | ICD-10-CM | POA: Insufficient documentation

## 2019-10-09 ENCOUNTER — Ambulatory Visit
Admission: RE | Admit: 2019-10-09 | Discharge: 2019-10-09 | Disposition: A | Discharge status: Home / Self Care | Payer: Medicare HMO | Source: Ambulatory Visit | Attending: Obstetrics & Gynecology | Admitting: Obstetrics & Gynecology

## 2019-10-09 DIAGNOSIS — Z1231 Encounter for screening mammogram for malignant neoplasm of breast: Secondary | ICD-10-CM | POA: Diagnosis present

## 2019-10-22 ENCOUNTER — Encounter: Payer: Self-pay | Admitting: Family

## 2020-01-09 ENCOUNTER — Encounter: Payer: Self-pay | Admitting: Family

## 2020-01-14 ENCOUNTER — Ambulatory Visit: Payer: Medicare Other | Attending: Internal Medicine

## 2020-01-14 DIAGNOSIS — Z23 Encounter for immunization: Secondary | ICD-10-CM | POA: Insufficient documentation

## 2020-01-14 NOTE — Progress Notes (Signed)
   Covid-19 Vaccination Clinic  Name:  Rachael Graham    MRN: [id number] DOB: 22-Sep-1951  01/14/2020  Rachael Graham was observed post Covid-19 immunization for 15 minutes without incidence. She was provided with Vaccine Information Sheet and instruction to access the V-Safe system.   Rachael Graham was instructed to call 911 with any severe reactions post vaccine: Marland Kitchen Difficulty breathing  . Swelling of your face and throat  . A fast heartbeat  . A bad rash all over your body  . Dizziness and weakness    Immunizations Administered    Name Date Dose VIS Date Route   Pfizer COVID-19 Vaccine 01/14/2020  1:24 PM 0.3 mL 12/05/2019 Intramuscular   Manufacturer: [place]   Lot: [id number]   [place]: [id number]

## 2020-02-03 ENCOUNTER — Ambulatory Visit: Payer: Medicare HMO | Attending: Internal Medicine

## 2020-02-03 DIAGNOSIS — Z23 Encounter for immunization: Secondary | ICD-10-CM | POA: Insufficient documentation

## 2020-02-03 NOTE — Progress Notes (Signed)
   Covid-19 Vaccination Clinic  Name:  Rachael Graham    MRN: [id number] DOB: August 17, 1951  02/03/2020  Rachael Graham was observed post Covid-19 immunization for 15 minutes without incidence. She was provided with Vaccine Information Sheet and instruction to access the V-Safe system.   Rachael Graham was instructed to call 911 with any severe reactions post vaccine: Marland Kitchen Difficulty breathing  . Swelling of your face and throat  . A fast heartbeat  . A bad rash all over your body  . Dizziness and weakness    Immunizations Administered    Name Date Dose VIS Date Route   Pfizer COVID-19 Vaccine 02/03/2020  8:17 AM 0.3 mL 12/05/2019 Intramuscular   Manufacturer: [place]   Lot: [id number]   [place]: [id number]

## 2020-02-19 ENCOUNTER — Ambulatory Visit: Payer: Medicare HMO | Admitting: Nurse Practitioner

## 2020-02-24 ENCOUNTER — Other Ambulatory Visit: Payer: Self-pay

## 2020-02-24 ENCOUNTER — Ambulatory Visit (INDEPENDENT_AMBULATORY_CARE_PROVIDER_SITE_OTHER): Payer: Medicare HMO | Admitting: Nurse Practitioner

## 2020-02-24 VITALS — BP 132/68 | HR 77 | Temp 96.9°F | Resp 16 | Ht 62.5 in | Wt 133.4 lb

## 2020-02-24 DIAGNOSIS — R519 Headache, unspecified: Secondary | ICD-10-CM

## 2020-02-24 DIAGNOSIS — R42 Dizziness and giddiness: Secondary | ICD-10-CM

## 2020-02-24 DIAGNOSIS — M6289 Other specified disorders of muscle: Secondary | ICD-10-CM | POA: Diagnosis not present

## 2020-02-24 DIAGNOSIS — R350 Frequency of micturition: Secondary | ICD-10-CM

## 2020-02-24 LAB — BASIC METABOLIC PANEL
BUN: 17 mg/dL (ref 6–23)
CO2: 32 mEq/L (ref 19–32)
Calcium: 9.4 mg/dL (ref 8.4–10.5)
Chloride: 103 mEq/L (ref 96–112)
Creatinine, Ser: 0.61 mg/dL (ref 0.40–1.20)
GFR: 98.03 mL/min (ref >60.00)
Glucose, Bld: 88 mg/dL (ref 70–99)
Potassium: 3.9 mEq/L (ref 3.5–5.1)
Sodium: 141 mEq/L (ref 135–145)

## 2020-02-24 LAB — URINALYSIS
Bilirubin Urine: NEGATIVE
Hgb urine dipstick: NEGATIVE
Ketones, ur: NEGATIVE
Leukocytes,Ua: NEGATIVE
Nitrite: NEGATIVE
Specific Gravity, Urine: 1.025 (ref 1.000–1.030)
Total Protein, Urine: NEGATIVE
Urine Glucose: NEGATIVE
Urobilinogen, UA: 0.2 (ref 0.0–1.0)
pH: 6 (ref 5.0–8.0)

## 2020-02-24 LAB — HEPATIC FUNCTION PANEL
ALT: 19 U/L (ref 0–35)
AST: 19 U/L (ref 0–37)
Albumin: 4.2 g/dL (ref 3.5–5.2)
Alkaline Phosphatase: 77 U/L (ref 39–117)
Bilirubin, Direct: 0.2 mg/dL (ref 0.0–0.3)
Total Bilirubin: 1.2 mg/dL (ref 0.2–1.2)
Total Protein: 6.9 g/dL (ref 6.0–8.3)

## 2020-02-24 LAB — CBC
HCT: 38.7 % (ref 36.0–46.0)
Hemoglobin: 13.1 g/dL (ref 12.0–15.0)
MCHC: 33.8 g/dL (ref 30.0–36.0)
MCV: 87.1 fl (ref 78.0–100.0)
Platelets: 251 10*3/uL (ref 150.0–400.0)
RBC: 4.45 Mil/uL (ref 3.87–5.11)
RDW: 13 % (ref 11.5–15.5)
WBC: 7.6 10*3/uL (ref 4.0–10.5)

## 2020-02-24 LAB — TSH: TSH: 1.54 u[IU]/mL (ref 0.35–4.50)

## 2020-02-24 NOTE — Patient Instructions (Signed)
Please go to the lab today.  Hold on taking the Advil. Monitor for headaches and keep record. Tylenol as needed. Pending labs - will consider CT versus MRI imaging to evaluate the cause of daily headaches, lightheaded feelings and unusual leg weakness.   It was nice to meet you today and we will be in touch with the lab results and further plan of care.

## 2020-02-24 NOTE — Progress Notes (Addendum)
Subjective:    Patient ID: Rachael Graham, female    DOB: May 06, 1951, 69 y.o.   MRN: [id number]  HPI  This 69 yo female comes in with chief complaint of bilateral  leg muscle fatigue that started about 6 weeks ago after her Covid vaccines. She also has and frequent urination.  No vertigo.  She also feels like her whole head is in a brain fog and has vague, intermittent lightheadedness.  She noted intermittent bilateral  leg muscle heavy feeling  in both legs onset 6 weeks or so.  She thinks these symptoms started after her Covid vaccines. No specific timeline for her cloudy brain feeling. She thinks they may be getting less frequent.  Her calves and thighs feel like she had just had a big workout and they are tired.    She does have daily HA. She takes 600 mg Advil once a day for daily moderate ache diffuse HA in the morning. She has a HA 300 days out of the year. No head  injuries or hx of migraines. No snoring, sleep apnea, or sleepiness in the daytime.  She had a concussion from a fall as a 69 yo-no problems over the years. She snores and her husband complains from time to times. No daytime sleepiness.   No urinary  burning. She has more frequent urination and nocturia 2-4 times. She spoke to her GYN and was told she has GYN issues from having 2 babies.   Review of Systems  HENT: Negative for congestion, ear pain, facial swelling, hearing loss and sinus pain.   Eyes: Negative for visual disturbance.  Respiratory: Negative for cough, chest tightness and shortness of breath.   Cardiovascular: Negative for palpitations and leg swelling.  Gastrointestinal: Negative for abdominal pain.  Endocrine:       Positive hot flashes started back  Genitourinary: Positive for frequency. Negative for dysuria.       Positive nocturia  Musculoskeletal: Negative for arthralgias, back pain, gait problem and joint swelling.  Neurological: Positive for dizziness, light-headedness and headaches. Negative for  tremors, seizures, syncope, speech difficulty, weakness and numbness.  Hematological: Negative for adenopathy.  Psychiatric/Behavioral: Negative for sleep disturbance.       No concerns about depression or anxiety      Objective:   Physical Exam Constitutional:      Appearance: Normal appearance. She is normal weight.  HENT:     Head: Normocephalic.  Eyes:     Extraocular Movements: Extraocular movements intact.     Conjunctiva/sclera: Conjunctivae normal.     Pupils: Pupils are equal, round, and reactive to light.  Neck:     Vascular: No carotid bruit.  Cardiovascular:     Rate and Rhythm: Normal rate and regular rhythm.     Pulses: Normal pulses.     Heart sounds: No murmur. No gallop.   Pulmonary:     Effort: Pulmonary effort is normal.     Breath sounds: Normal breath sounds.  Abdominal:     General: Abdomen is flat.     Palpations: Abdomen is soft.  Musculoskeletal:        General: No swelling or tenderness. Normal range of motion.     Cervical back: No rigidity.     Left lower leg: No edema.  Lymphadenopathy:     Cervical: No cervical adenopathy.  Skin:    General: Skin is warm and dry.  Neurological:     General: No focal deficit present.     Mental  Status: She is alert and oriented to person, place, and time.     Cranial Nerves: No cranial nerve deficit.     Motor: No weakness.     Gait: Gait normal.  Psychiatric:        Mood and Affect: Mood normal.        Behavior: Behavior normal.        Assessment & Plan:   1. Intermittent lightheaded 2. Muscle fatigue- heavy feeling legs only, no numbness, weakness or falls 3. Daily chronic morning HA self treated with Advil 4.  Woozy head feeling 5. Urinary frequency  The patient comes in today with several vague complaints but they are bothersome to her.  Her chief complaint was heaviness and muscle fatigue in her legs as well as urinary frequency.  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  She has had no weakness or falls.  She also mentions a woozy, foggy head that is unusual for her.  Review of systems revealed daily headache treated with Advil and not investigated.  No history of sleep apnea or apneic spells, snores rarely, no trouble with sleep or daytime sleepiness. She does not have a body habitus to support sleep apnea.   Plan to start with routine labs and UA.  Hold on taking the Advil. Monitor for headaches and keep record. No sleep apnea symptoms. Tylenol as needed. Pending labs - will consider CT versus MRI imaging to evaluate the cause of daily headaches, lightheaded feelings and unusual leg heaviness. She may need Neurology referral.   This visit occurred during the SARS-CoV-2 public health emergency.  Safety protocols were in place, including screening questions prior to the visit, additional usage of staff PPE, and extensive cleaning of exam room while observing appropriate contact time as indicated for disinfecting solutions.   02/25/2020: Lab addendum: We discussed the normal laboratory studies today.  Patient has not had a headache for 2 days.  Her leg muscle heaviness is also improved.  She wants to defer any further work-up for 1 to 2 weeks as she thinks she may be improving.  Stephanye will let me know via MyChart if her symptoms return and we will place a neurology consult. Denice Paradise, ANP

## 2020-02-25 ENCOUNTER — Encounter: Payer: Self-pay | Admitting: Nurse Practitioner

## 2020-03-09 ENCOUNTER — Encounter: Payer: Self-pay | Admitting: Family

## 2020-03-10 ENCOUNTER — Other Ambulatory Visit: Payer: Self-pay | Admitting: Family

## 2020-03-10 DIAGNOSIS — R531 Weakness: Secondary | ICD-10-CM

## 2020-03-15 ENCOUNTER — Other Ambulatory Visit: Payer: Self-pay

## 2020-03-15 ENCOUNTER — Telehealth: Payer: Self-pay | Admitting: Neurology

## 2020-03-15 ENCOUNTER — Encounter: Payer: Self-pay | Admitting: Neurology

## 2020-03-15 ENCOUNTER — Ambulatory Visit: Payer: Medicare HMO | Admitting: Neurology

## 2020-03-15 VITALS — BP 178/78 | HR 67 | Temp 97.4°F | Ht 63.0 in | Wt 130.0 lb

## 2020-03-15 DIAGNOSIS — R0683 Snoring: Secondary | ICD-10-CM

## 2020-03-15 DIAGNOSIS — R519 Headache, unspecified: Secondary | ICD-10-CM

## 2020-03-15 DIAGNOSIS — R351 Nocturia: Secondary | ICD-10-CM

## 2020-03-15 DIAGNOSIS — R29898 Other symptoms and signs involving the musculoskeletal system: Secondary | ICD-10-CM

## 2020-03-15 DIAGNOSIS — R419 Unspecified symptoms and signs involving cognitive functions and awareness: Secondary | ICD-10-CM | POA: Diagnosis not present

## 2020-03-15 DIAGNOSIS — R42 Dizziness and giddiness: Secondary | ICD-10-CM

## 2020-03-15 NOTE — Telephone Encounter (Signed)
[employer] medicare order sent to GI. They will obtain the auth and reach out to the patient to schedule.  °

## 2020-03-15 NOTE — Patient Instructions (Signed)
Your history is fairly benign, neurological exam is normal thankfully.  As discussed, we will proceed with a brain scan, called MRI and call you with the test results. We will have to schedule you for this on a separate date. This test requires authorization from your insurance, and we will take care of the insurance process.  I would recommend we rule out obstructive sleep apnea by doing a sleep study.  Please think about it.  Call us back if you change your mind regarding a sleep study.  We will keep you posted as to your MRI results and I will follow you as needed.  We can consider electrical testing in our office, called an EMG and nerve conduction velocity test, which is an electrical nerve and muscle test, if your symptoms get worse.

## 2020-03-15 NOTE — Progress Notes (Signed)
Subjective:    Patient ID: Rachael Graham is a 69 y.o. female.  HPI     Star Age, MD, PhD [facility] [address]O. [place], [place] [address]  Dear Rachael Graham,  I saw your patient, Rachael Graham, upon your kind request, in my neurologic clinic today for initial consultation of her leg muscle weakness.  The patient is unaccompanied today.  As you know, [name] is a 69 year old right-handed woman with an underlying medical history of arthritis, who reports intermittent leg weakness for the past 2 months.  She has had other vague symptoms including dizziness and lightheadedness, not vertigo type symptoms.  She has had intermittent headaches, these are milder, not debilitating, generalized and dull and achy.  Sometimes she takes over-the-counter Advil or Tylenol.  She is generally speaking active and healthy and exercises on a regular basis, tries to hydrate well with water.  She has some sleep disturbance, reports snoring, intermittent restless leg symptoms which are not debilitating or frequent but she has woken up occasionally with a headache and has nocturia about 2-3 times per average night.  She reports that her GYN told her she has weakness of the pelvic floor from having children.  She denies any gasping sensations or witnessed apneas.  She has never had a sleep study.  She has not had a brain scan.  She denies any sudden onset of one-sided weakness or numbness or tingling or droopy face or slurring of speech, she denies any pain.  Her weakness seems to be at random, not exacerbated by activity.  She feels it in her thigh muscles, denies any low back pain or radiating symptoms from the neck or lower back.   Her Past Medical History Is Significant For: Past Medical History:  Diagnosis Date  . Arthritis    knee, s/p steroid injection Dr. Alvan Dame  . Chicken pox   . Colon polyp   . Trigger finger    Followed at [facility], Dr. Veronia Beets     Her Past Surgical History Is Significant For: Past Surgical History:  Procedure Laterality Date  . AUGMENTATION MAMMAPLASTY Bilateral 1988  . BREAST BIOPSY     bilateral, both benign  . KNEE ARTHROSCOPY    . TONSILLECTOMY AND ADENOIDECTOMY  1964  . TRIGGER FINGER RELEASE     Dr. Veronia Beets  . TUBAL LIGATION    . VAGINAL DELIVERY      Her Family History Is Significant For: Family History  Problem Relation Age of Onset  . Arthritis Mother   . Hyperlipidemia Mother   . Heart disease Mother   . Hypertension Mother   . Stroke Mother   . Arthritis Father   . Hyperlipidemia Father   . Heart disease Father   . Hypertension Father   . Parkinsonism Father   . Crohn's disease Sister   . Obesity Sister   . Arthritis Maternal Grandmother   . Cancer Maternal Grandmother        colon  . Arthritis Maternal Grandfather   . Cancer Maternal Grandfather        colon  . Breast cancer Neg Hx     Her Social History Is Significant For: Social History   Socioeconomic History  . Marital status: Married    Spouse name: Not on file  . Number of children: Not on file  . Years of education: Not on file  . Highest education level: Not on file  Occupational History  . Not on file  Tobacco Use  . Smoking status: Never Smoker  . Smokeless tobacco: Never Used  Substance and Sexual Activity  . Alcohol use: Yes    Comment: occaisionaly  . Drug use: No  . Sexual activity: Not on file  Other Topics Concern  . Not on file  Social History Narrative   Lives in [place]. From [place]. Two children in [place].      Diet - Regular   Exercise - tennis   Social Determinants of Health   Financial Resource Strain:   . Difficulty of Paying Living Expenses:   Food Insecurity:   . Worried About Charity fundraiser in the Last Year:   . Arboriculturist in the Last Year:   Transportation Needs:   . Film/video editor (Medical):   Marland Kitchen Lack of Transportation (Non-Medical):   Physical Activity:    . Days of Exercise per Week:   . Minutes of Exercise per Session:   Stress:   . Feeling of Stress :   Social Connections:   . Frequency of Communication with Friends and Family:   . Frequency of Social Gatherings with Friends and Family:   . Attends Religious Services:   . Active Member of Clubs or Organizations:   . Attends Archivist Meetings:   Marland Kitchen Marital Status:     Her Allergies Are:  No Known Allergies:   Her Current Medications Are:  Outpatient Encounter Medications as of 03/15/2020  Medication Sig  . calcium carbonate (TUMS - DOSED IN MG ELEMENTAL CALCIUM) 500 MG chewable tablet Chew 1 tablet by mouth as needed for indigestion or heartburn.  Marland Kitchen ibuprofen (ADVIL) 100 MG tablet Take 100 mg by mouth as needed for fever. Take three tablets daily as needed.  Marland Kitchen omeprazole (PRILOSEC) 20 MG capsule Take 20 mg by mouth daily.   No facility-administered encounter medications on file as of 03/15/2020.  :   Review of Systems:  Out of a complete 14 point review of systems, all are reviewed and negative with the exception of these symptoms as listed below:  Review of Systems  Neurological:       Pt presents today to discuss dull headache and weakness. Pt states headaches started January of this year.    Objective:  Neurological Exam  Physical Exam Physical Examination:   Vitals:   03/15/20 0807  BP: (!) 178/78  Pulse: 67  Temp: (!) 97.4 F (36.3 C)    General Examination: The patient is a very pleasant 69 y.o. female in no acute distress. She appears well-developed and well-nourished and well groomed. No lightheadedness upon standing. Denies vertiginous Sx.  HEENT: Normocephalic, atraumatic, pupils are equal, round and reactive to light. Corrective eye glasses. Funduscopic exam is normal with sharp disc margins noted. Extraocular tracking is good without limitation to gaze excursion or nystagmus noted. Normal smooth pursuit is noted. Hearing is grossly intact.  Face is symmetric with normal facial animation and normal facial sensation. Speech is clear with no dysarthria noted. There is no hypophonia. There is no lip, neck/head, jaw or voice tremor. Neck is supple with full range of passive and active motion. There are no carotid bruits on auscultation. Oropharynx exam reveals: no mouth dryness, good dental hygiene and mild airway crowding, due to smaller airway, redundant soft palate. Mallampati is class I. Tongue protrudes centrally and palate elevates symmetrically. Tonsils are absent.   Chest: Clear to auscultation without wheezing, rhonchi or crackles noted.  Heart: S1+S2+0, regular and normal without  murmurs, rubs or gallops noted.   Abdomen: Soft, non-tender and non-distended with normal bowel sounds appreciated on auscultation.  Extremities: There is no pitting edema in the distal lower extremities bilaterally.  Skin: Warm and dry without trophic changes noted.  Musculoskeletal: exam reveals arthritic changes in the hands.   Neurologically:  Mental status: The patient is awake, alert and oriented in all 4 spheres. Her immediate and remote memory, attention, language skills and fund of knowledge are appropriate. There is no evidence of aphasia, agnosia, apraxia or anomia. Speech is clear with normal prosody and enunciation. Thought process is linear. Mood is normal and affect is normal.  Cranial nerves II - XII are as described above under HEENT exam. In addition: shoulder shrug is normal with equal shoulder height noted. Motor exam: Normal bulk, strength and tone is noted. There is no drift, tremor or rebound. Romberg is negative. Reflexes are 2+ throughout. Babinski: Toes are flexor bilaterally. Fine motor skills and coordination: intact with normal finger taps, normal hand movements, normal rapid alternating patting, normal foot taps and normal foot agility.  Cerebellar testing: No dysmetria or intention tremor on finger to nose testing. Heel to  shin is unremarkable bilaterally. There is no truncal or gait ataxia.  Sensory exam: intact to light touch, vibration, temperature sense in the upper and lower extremities.  Gait, station and balance: She stands easily. No veering to one side is noted. No leaning to one side is noted. Posture is age-appropriate and stance is narrow based. Gait shows normal stride length and normal pace. No problems turning are noted. Tandem walk is unremarkable.               Assessment and Plan:   In summary, Marissah Buri is a very pleasant 69 y.o.-year old female with an underlying medical history of arthritis, who presents for evaluation of her lower extremity weakness, also intermittent milder headaches and intermittent dizziness.  Her exam is benign, history not suggestive of any sinister underlying primary neurological cause.  Nevertheless, I do believe she may be at risk for underlying sleep disordered breathing despite her benign physical exam in that regard.  Underlying obstructive sleep apnea may explain some of her vague daytime symptoms including her foggy headedness.  She is overall reassured but encouraged to consider coming in for a sleep study.  She would like to think about it.  I explained the sleep test procedure to her. She had a recent eye examination and had a slight change in her prescription eyeglasses she reports.  She is advised that we could consider an EMG nerve conduction velocity test should her symptoms worsen.  For now, there is no obvious evidence for a underlying muscle or nerve disease.  Given her recent onset of headaches, I think it is reasonable to rule out a structural cause of her headaches.  To that end, I will order a brain MRI with and without contrast.  We will keep her posted as to her MRI results by phone call, I would be happy to see her back as needed.  If she changes her mind regarding pursuing the sleep study, she is advised to call our office anytime.  I answered all her  questions today and she was in agreement.  Thank you very much for allowing me to participate in the care of this nice patient. If I can be of any further assistance to you please do not hesitate to call me at [phone].  Sincerely,   Star Age,  MD, PhD

## 2020-03-29 ENCOUNTER — Ambulatory Visit (INDEPENDENT_AMBULATORY_CARE_PROVIDER_SITE_OTHER): Payer: Medicare HMO

## 2020-03-29 ENCOUNTER — Other Ambulatory Visit: Payer: Self-pay

## 2020-03-29 VITALS — Ht 63.0 in | Wt 130.0 lb

## 2020-03-29 DIAGNOSIS — Z0000 Encounter for general adult medical examination without abnormal findings: Secondary | ICD-10-CM

## 2020-03-29 NOTE — Patient Instructions (Addendum)
  Ms. Funck , Thank you for taking time to come for your Medicare Wellness Visit. I appreciate your ongoing commitment to your health goals. Please review the following plan we discussed and let me know if I can assist you in the future.   These are the goals we discussed: Goals      Patient Stated   . I want to lose a little weight (pt-stated)     Low carb diet Increase activity       This is a list of the screening recommended for you and due dates:  Health Maintenance  Topic Date Due  . Pneumonia vaccines (2 of 2 - PPSV23) 10/28/2018  . Flu Shot  07/25/2020  . Colon Cancer Screening  12/25/2020  . Mammogram  10/08/2021  . Tetanus Vaccine  08/07/2027  . DEXA scan (bone density measurement)  Completed  .  Hepatitis C: One time screening is recommended by Center for Disease Control  (CDC) for  adults born from [birth year] through 1963.   Completed

## 2020-03-29 NOTE — Progress Notes (Addendum)
Subjective:   Rachael Graham is a 69 y.o. female who presents for an Initial Medicare Annual Wellness Visit.  Review of Systems    No ROS.  Medicare Wellness Virtual Visit.  Visual/audio telehealth visit, UTA vital signs.   See social history for additional risk factors.    Cardiac Risk Factors include: advanced age (>25men, >86 women)     Objective:    Today's Vitals   03/29/20 0841  Weight: 130 lb (59 kg)  Height: 5\' 3"  (1.6 m)   Body mass index is 23.03 kg/m.  Advanced Directives 03/29/2020 12/16/2017  Does Patient Have a Medical Advance Directive? Yes Yes  Type of Paramedic of [place];Living will [place];Living will  Copy of [place] in Chart? No - copy requested -    Current Medications (verified) Outpatient Encounter Medications as of 03/29/2020  Medication Sig  . calcium carbonate (TUMS - DOSED IN MG ELEMENTAL CALCIUM) 500 MG chewable tablet Chew 1 tablet by mouth as needed for indigestion or heartburn.  Marland Kitchen ibuprofen (ADVIL) 100 MG tablet Take 100 mg by mouth as needed for fever. Take three tablets daily as needed.  Marland Kitchen omeprazole (PRILOSEC) 20 MG capsule Take 20 mg by mouth daily.   No facility-administered encounter medications on file as of 03/29/2020.    Allergies (verified) Patient has no known allergies.   History: Past Medical History:  Diagnosis Date  . Arthritis    knee, s/p steroid injection Dr. Alvan Dame  . Chicken pox   . Colon polyp   . Trigger finger    Followed at [facility], Dr. Veronia Beets   Past Surgical History:  Procedure Laterality Date  . AUGMENTATION MAMMAPLASTY Bilateral 1988  . BREAST BIOPSY     bilateral, both benign  . KNEE ARTHROSCOPY    . TONSILLECTOMY AND ADENOIDECTOMY  1964  . TRIGGER FINGER RELEASE     Dr. Veronia Beets  . TUBAL LIGATION    . VAGINAL DELIVERY     Family History  Problem Relation Age of Onset  . Arthritis Mother   . Hyperlipidemia Mother     . Heart disease Mother   . Hypertension Mother   . Stroke Mother   . Arthritis Father   . Hyperlipidemia Father   . Heart disease Father   . Hypertension Father   . Parkinsonism Father   . Crohn's disease Sister   . Obesity Sister   . Arthritis Maternal Grandmother   . Cancer Maternal Grandmother        colon  . Arthritis Maternal Grandfather   . Cancer Maternal Grandfather        colon  . Breast cancer Neg Hx    Social History   Socioeconomic History  . Marital status: Married    Spouse name: Not on file  . Number of children: Not on file  . Years of education: Not on file  . Highest education level: Not on file  Occupational History  . Not on file  Tobacco Use  . Smoking status: Never Smoker  . Smokeless tobacco: Never Used  Substance and Sexual Activity  . Alcohol use: Yes    Comment: occaisionaly  . Drug use: No  . Sexual activity: Not on file  Other Topics Concern  . Not on file  Social History Narrative   Lives in [place]. From [place]. Two children in [place].      Diet - Regular   Exercise - tennis   Social Determinants of  Health   Financial Resource Strain: Low Risk   . Difficulty of Paying Living Expenses: Not hard at all  Food Insecurity: No Food Insecurity  . Worried About Charity fundraiser in the Last Year: Never true  . Ran Out of Food in the Last Year: Never true  Transportation Needs: No Transportation Needs  . Lack of Transportation (Medical): No  . Lack of Transportation (Non-Medical): No  Physical Activity: Sufficiently Active  . Days of Exercise per Week: 5 days  . Minutes of Exercise per Session: 30 min  Stress: No Stress Concern Present  . Feeling of Stress : Not at all  Social Connections: Unknown  . Frequency of Communication with Friends and Family: More than three times a week  . Frequency of Social Gatherings with Friends and Family: Not on file  . Attends Religious Services: Not on file  . Active Member of Clubs or  Organizations: Yes  . Attends Archivist Meetings: Not on file  . Marital Status: Married    Tobacco Counseling Counseling given: Not Answered   Clinical Intake:  Pre-visit preparation completed: Yes           How often do you need to have someone help you when you read instructions, pamphlets, or other written materials from your doctor or pharmacy?: 1 - Never  Interpreter Needed?: No      Activities of Daily Living In your present state of health, do you have any difficulty performing the following activities: 03/29/2020  Hearing? N  Vision? N  Difficulty concentrating or making decisions? N  Walking or climbing stairs? N  Dressing or bathing? N  Doing errands, shopping? N  Preparing Food and eating ? N  Using the Toilet? N  In the past six months, have you accidently leaked urine? N  Do you have problems with loss of bowel control? N  Managing your Medications? N  Managing your Finances? N  Housekeeping or managing your Housekeeping? N  Some recent data might be hidden     Immunizations and Health Maintenance Immunization History  Administered Date(s) Administered  . Fluad Quad(high Dose 65+) 08/26/2019  . Influenza Split 11/14/2012  . Influenza,inj,Quad PF,6+ Mos 10/16/2013, 10/06/2015, 10/10/2016, 09/17/2018  . PFIZER SARS-COV-2 Vaccination 01/14/2020, 02/03/2020  . Pneumococcal Conjugate-13 12/25/2009  . Tdap 12/25/2008, 08/06/2017   Health Maintenance Due  Topic Date Due  . PNA vac Low Risk Adult (2 of 2 - PPSV23) 10/28/2018    Patient Care Team: Burnard Hawthorne, FNP as PCP - General (Family Medicine)  Indicate any recent Medical Services you may have received from other than [facility] providers in the past year (date may be approximate).     Assessment:   This is a routine wellness examination for [place].  Nurse connected with patient 03/29/20 at  8:30 AM EDT by a telephone enabled telemedicine application and verified that I am  speaking with the correct person using two identifiers. Patient stated full name and DOB. Patient gave permission to continue with virtual visit. Patient's location was at home and Nurse's location was at [place] office.   Patient is alert and oriented x3. Patient denies difficulty focusing or concentrating. Patient likes to read and play words with friends for brain stimulation.   Health Maintenance Due: -PNA vaccine- discussed; to be completed with doctor in visit or local pharmacy.  -Shingles- declined See completed HM at the end of note.   Eye: Visual acuity not assessed. Virtual visit. Followed by their ophthalmologist.  Dental: Visits every 6 months.    Hearing: Demonstrates normal hearing during visit.  Safety:  Patient feels safe at home- yes Patient does have smoke detectors at home- yes Patient does wear sunscreen or protective clothing when in direct sunlight - yes Patient does wear seat belt when in a moving vehicle - yes Patient drives- yes Adequate lighting in walkways free from debris- yes Grab bars and handrails used as appropriate- yes Ambulates with an assistive device- no  Social: Alcohol intake - yes      Smoking history- never   Smokers in home? none Illicit drug use? none  Medication: Taking as directed and without issues.  Self managed - yes   Covid-19: Precautions and sickness symptoms discussed. Wears mask, social distancing, hand hygiene as appropriate.   Activities of Daily Living Patient denies needing assistance with: household chores, feeding themselves, getting from bed to chair, getting to the toilet, bathing/showering, dressing, managing money, or preparing meals.   Discussed the importance of a healthy diet, water intake and the benefits of aerobic exercise.   Physical activity- walking, active at home, no routine.   Diet:  Regular Water: good intake  Other Providers Patient Care Team: Burnard Hawthorne, FNP as PCP - General  (Family Medicine)  Hearing/Vision screen  Hearing Screening   125Hz  250Hz  500Hz  1000Hz  2000Hz  3000Hz  4000Hz  6000Hz  8000Hz   Right ear:           Left ear:           Comments: Patient is able to hear conversational tones without difficulty.  No issues reported.  Vision Screening Comments: Wears corrective lenses Visual acuity not assessed, virtual visit.  They have seen their ophthalmologist in the last 12 months.     Dietary issues and exercise activities discussed: Current Exercise Habits: Home exercise routine, Type of exercise: walking, Intensity: Mild  Goals      Patient Stated   . I want to lose a little weight (pt-stated)     Low carb diet Increase activity      Depression Screen PHQ 2/9 Scores 03/29/2020 08/06/2017 01/01/2017  PHQ - 2 Score 0 0 0    Fall Risk Fall Risk  03/29/2020 08/06/2017 01/01/2017  Falls in the past year? 0 No No  Follow up Falls evaluation completed;Falls prevention discussed - -   Timed Get Up and Go Performed no, virtual visit  Cognitive Function:     6CIT Screen 03/29/2020  What Year? 0 points  What month? 0 points  What time? 0 points  Count back from 20 0 points  Months in reverse 0 points    Screening Tests Health Maintenance  Topic Date Due  . PNA vac Low Risk Adult (2 of 2 - PPSV23) 10/28/2018  . INFLUENZA VACCINE  07/25/2020  . COLONOSCOPY  12/25/2020  . MAMMOGRAM  10/08/2021  . TETANUS/TDAP  08/07/2027  . DEXA SCAN  Completed  . Hepatitis C Screening  Completed     Plan:    Keep all routine maintenance appointments.   Follow up scheduled for MRI next week. Notes doing much better with symptoms decreasing after visit with Neurology.   Medicare Attestation I have personally reviewed: The patient's medical and social history Their use of alcohol, tobacco or illicit drugs Their current medications and supplements The patient's functional ability including ADLs,fall risks, home safety risks, cognitive, and hearing and visual  impairment Diet and physical activities Evidence for depression   I have reviewed and discussed with patient certain  preventive protocols, quality metrics, and best practice recommendations.   Varney Biles, LPN   [id number]     Agree with plan. Mable Paris, NP

## 2020-04-04 ENCOUNTER — Other Ambulatory Visit: Payer: Medicare HMO

## 2020-04-05 ENCOUNTER — Other Ambulatory Visit: Payer: Self-pay

## 2020-04-05 ENCOUNTER — Ambulatory Visit
Admission: RE | Admit: 2020-04-05 | Discharge: 2020-04-05 | Disposition: A | Discharge status: Home / Self Care | Payer: Medicare HMO | Source: Ambulatory Visit | Attending: Neurology | Admitting: Neurology

## 2020-04-05 DIAGNOSIS — R29898 Other symptoms and signs involving the musculoskeletal system: Secondary | ICD-10-CM | POA: Diagnosis not present

## 2020-04-05 DIAGNOSIS — R0683 Snoring: Secondary | ICD-10-CM

## 2020-04-05 DIAGNOSIS — R419 Unspecified symptoms and signs involving cognitive functions and awareness: Secondary | ICD-10-CM | POA: Diagnosis not present

## 2020-04-05 DIAGNOSIS — R519 Headache, unspecified: Secondary | ICD-10-CM | POA: Diagnosis not present

## 2020-04-05 DIAGNOSIS — R351 Nocturia: Secondary | ICD-10-CM

## 2020-04-05 DIAGNOSIS — R42 Dizziness and giddiness: Secondary | ICD-10-CM

## 2020-04-05 MED ORDER — GADOBENATE DIMEGLUMINE 529 MG/ML IV SOLN
12.0000 mL | Freq: Once | INTRAVENOUS | Status: AC | PRN
  Administered 2020-04-05: 15:00:00 12 mL via INTRAVENOUS

## 2020-04-05 NOTE — Progress Notes (Signed)
Brain MRI with and without contrast showed no obvious cause of her intermittent headaches, leg weakness or dizziness.  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 No abnormal contrast uptake and no acute abnormalities were seen.  If she would like, we can pursue a sleep study as we had discussed.  Otherwise, I would be happy to see her back as needed.

## 2020-04-06 ENCOUNTER — Telehealth: Payer: Self-pay | Admitting: *Deleted

## 2020-04-06 NOTE — Telephone Encounter (Signed)
Called, LVM for pt about results per Dr. Rexene Alberts note. Asked her to call back if she would like to proceed with sleep study that Dr. Rexene Alberts mentioned at office visit. Provided office number

## 2020-04-06 NOTE — Telephone Encounter (Signed)
-----   Message from Wyvonnia Lora, RN sent at 04/06/2020  5:37 PM EDT -----  ----- Message ----- From: Darleen Crocker, RN Sent: 04/06/2020   9:09 AM EDT To: Darleen Crocker, RN   ----- Message ----- From: Star Age, MD Sent: 04/05/2020   5:52 PM EDT To: Lester [place], RN  Brain MRI with and without contrast showed no obvious cause of her intermittent headaches, leg weakness or dizziness.  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 No abnormal contrast uptake and no acute abnormalities were seen.  If she would like, we can pursue a sleep study as we had discussed.  Otherwise, I would be happy to see her back as needed.

## 2020-04-12 NOTE — Telephone Encounter (Signed)
Called pt to f/u with her. She received message from last week. She states her sx have completely resolved and she is going to hold off on the sleep study. She will call back if sx return or if she has any new sx. Nothing further needed.

## 2020-04-14 ENCOUNTER — Other Ambulatory Visit: Payer: Medicare HMO

## 2020-05-10 IMAGING — MG DIGITAL SCREENING BREAST BILAT IMPLANT W/ TOMO W/ CAD
9 of 16 series · 9 of 32 positions shown · non-contrast
Comparison: Previous exam(s).

CLINICAL DATA: Screening.

EXAM:
DIGITAL SCREENING BILATERAL MAMMOGRAM WITH IMPLANTS, CAD AND TOMO
The patient has retropectoral implants. Standard and implant
displaced views were performed.

[R MLO (1 of 2)]
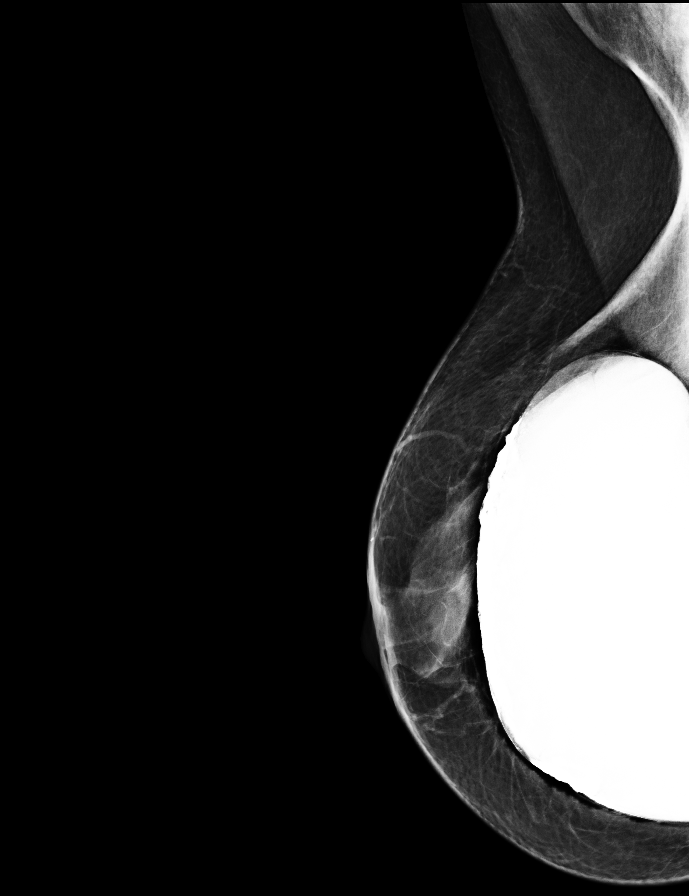

[L CC]
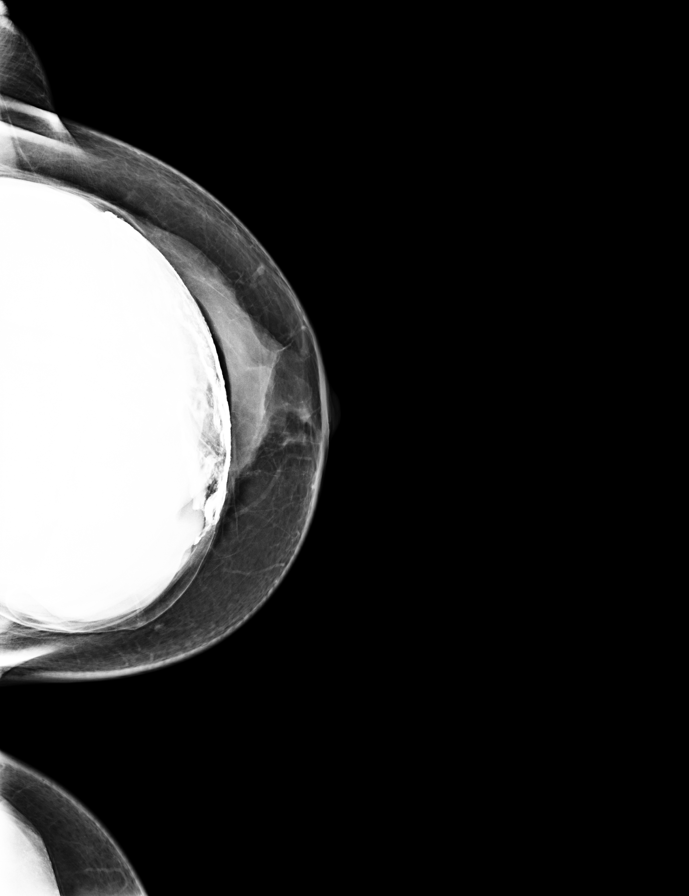

[L MLO (1 of 2)]
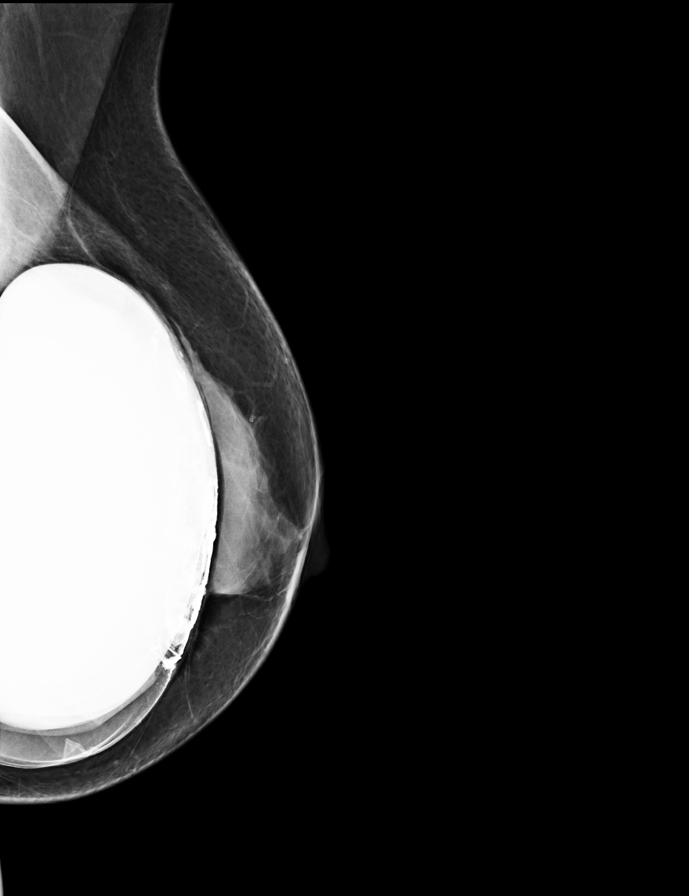

[R CC]
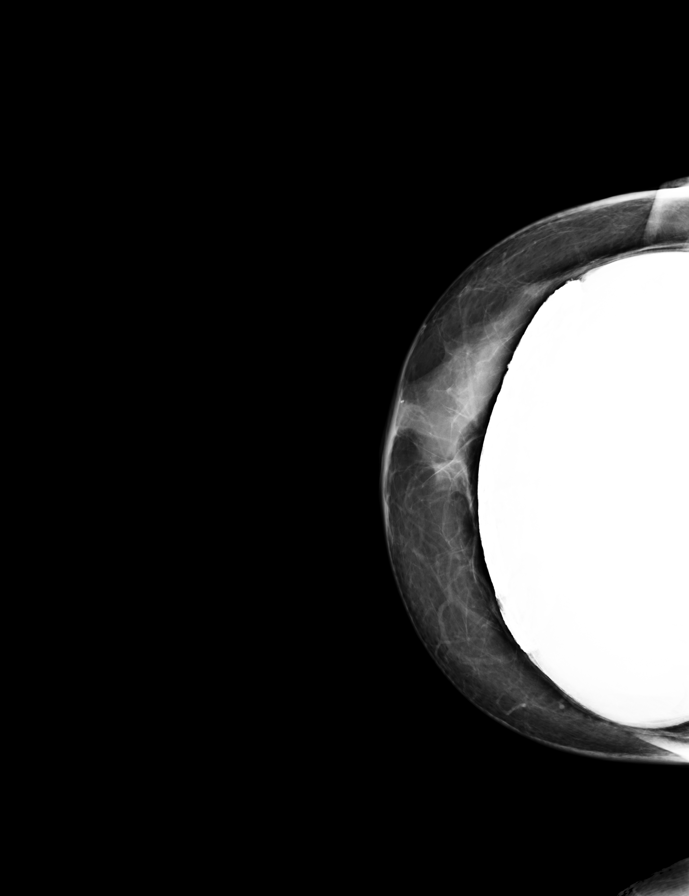

[L MLO synth-2D]
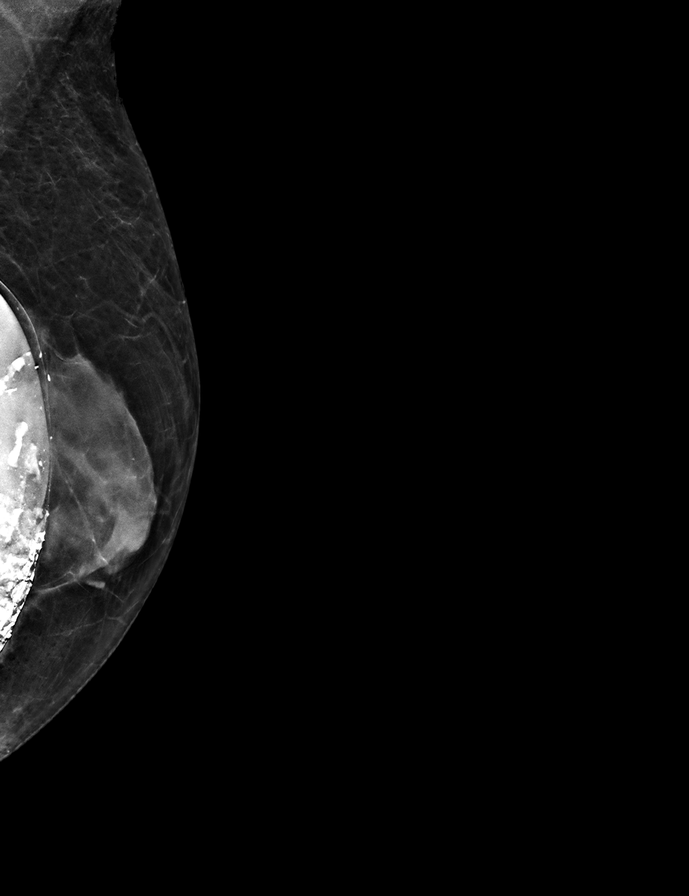

[L MLO (2 of 2)]
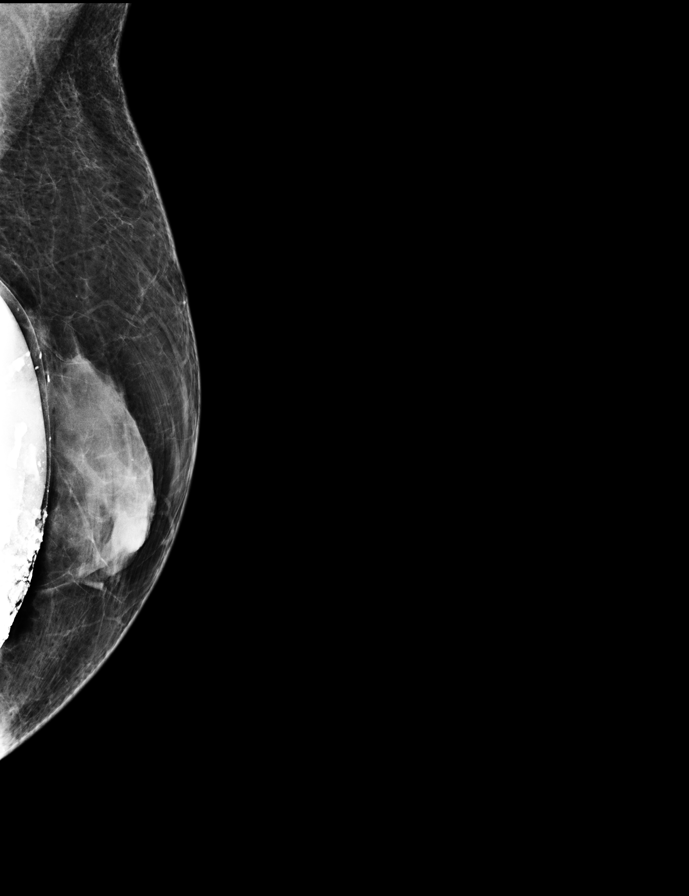

[L CC synth-2D]
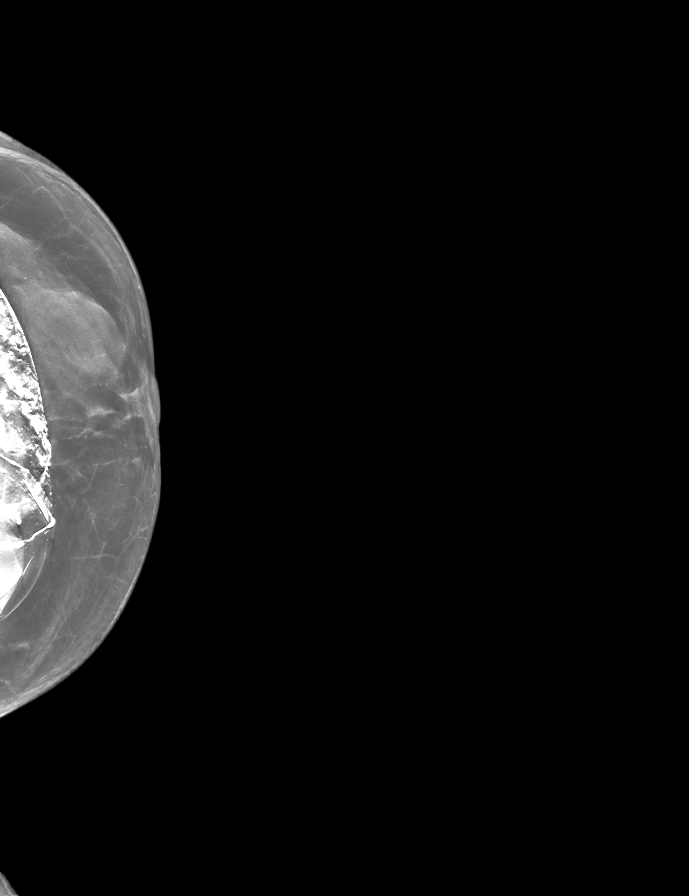

[R MLO synth-2D]
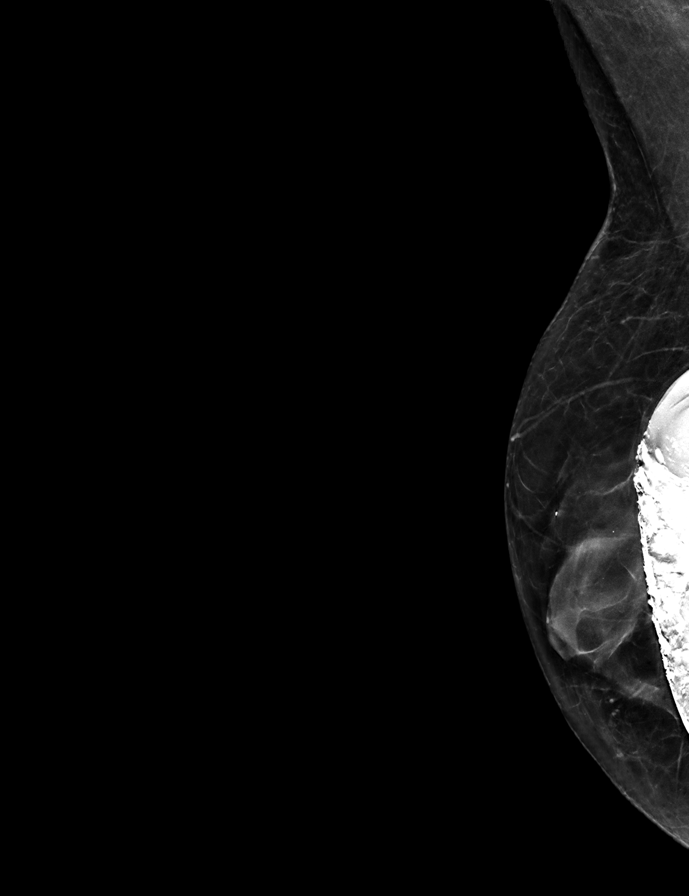

[R MLO (2 of 2)]
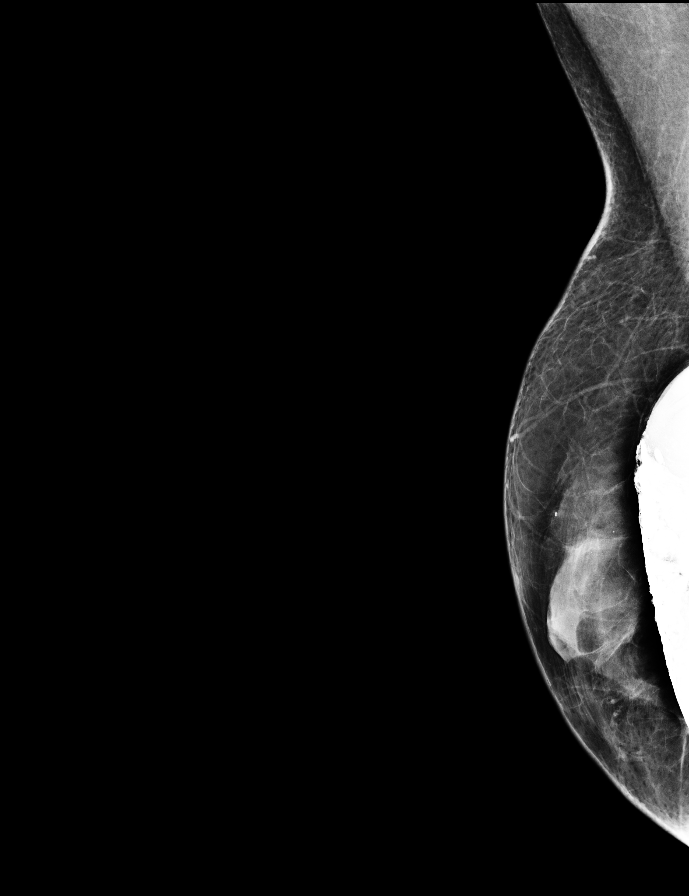

[9 of 32 positions shown; findings below may reference images not displayed]

ACR Breast Density Category b: There are scattered areas of
fibroglandular density.
FINDINGS: There are no findings suspicious for malignancy. Images were
processed with CAD.
IMPRESSION: No mammographic evidence of malignancy. A result letter of this
screening mammogram will be mailed directly to the patient.

RECOMMENDATION:
Screening mammogram in one year. (Code:60-T-8Z4)

BI-RADS CATEGORY  1:  Negative.

## 2020-09-08 ENCOUNTER — Telehealth (INDEPENDENT_AMBULATORY_CARE_PROVIDER_SITE_OTHER): Payer: Medicare HMO | Admitting: Family

## 2020-09-08 ENCOUNTER — Other Ambulatory Visit: Payer: Self-pay

## 2020-09-08 ENCOUNTER — Encounter: Payer: Self-pay | Admitting: Family

## 2020-09-08 DIAGNOSIS — F419 Anxiety disorder, unspecified: Secondary | ICD-10-CM

## 2020-09-08 DIAGNOSIS — R351 Nocturia: Secondary | ICD-10-CM | POA: Diagnosis not present

## 2020-09-08 DIAGNOSIS — N950 Postmenopausal bleeding: Secondary | ICD-10-CM | POA: Insufficient documentation

## 2020-09-08 MED ORDER — LORAZEPAM 0.5 MG PO TABS: 0.5000 mg | ORAL_TABLET | Freq: Two times a day (BID) | ORAL | 1 refills | Status: DC | PRN

## 2020-09-08 NOTE — Progress Notes (Signed)
Verbal consent for services obtained from patient prior to services given to TELEPHONE visit:   Location of call:  provider at work patient at home  Names of all persons present for services: Mable Paris, NP and patient Chief complaint:  Complains nocturia for years, unchanged.  Feels that bladder is emptied after voids. Never had UTI.  Worsened with fruit juice, caffeine. Has one beer at dinner. Drinks half  Glass of water at dinner.  H/o tubal ligation Nothing felt from protruding from vagina.  Snores. HA's resolved.   No changes to bowel  Habits. No constipation.   No dysuria, hematuria.   Very rare stress incontinence. Not wearing liner.   Anxiety- well controlled. Would like refill of ativan for use as needed.   Due pneumococcal 56  Follows with GYN, Dr ward.   History, background, results pertinent:  Recent consult with neurology due to HA, Dr Rexene Alberts  Due mammogram and pneumococcal 223 A/P/next steps:  Problem List Items Addressed This Visit      Other   Anxiety    Very stable. Rare use of ativan. Refilled ativan.  I looked up patient on  Controlled Substances Reporting System PMP AWARE and saw no activity that raised concern of inappropriate use.        Relevant Medications   LORazepam (ATIVAN) 0.5 MG tablet   Nocturia more than twice per night - Primary    Chronic, more bothersome. Referral to Dr Erlene Quan as patient would like to discuss options. Will follow       Relevant Orders   Ambulatory referral to Urology      I spent 15 min  discussing plan of care over the phone.

## 2020-09-08 NOTE — Assessment & Plan Note (Signed)
Chronic, more bothersome. Referral to Dr Erlene Quan as patient would like to discuss options. Will follow

## 2020-09-08 NOTE — Assessment & Plan Note (Addendum)
Very stable. Rare use of ativan. Refilled ativan.  I looked up patient on [place] Controlled Substances Reporting System PMP AWARE and saw no activity that raised concern of inappropriate use.

## 2020-09-10 ENCOUNTER — Ambulatory Visit: Payer: Medicare HMO

## 2020-09-16 ENCOUNTER — Other Ambulatory Visit: Payer: Self-pay

## 2020-09-16 ENCOUNTER — Ambulatory Visit (INDEPENDENT_AMBULATORY_CARE_PROVIDER_SITE_OTHER): Payer: Medicare HMO

## 2020-09-16 DIAGNOSIS — Z23 Encounter for immunization: Secondary | ICD-10-CM

## 2020-09-16 NOTE — Progress Notes (Signed)
Patient presented for pneumonia 23 injection to right deltoid, patient voiced no concerns nor showed any signs of distress during injection.

## 2020-09-18 NOTE — Progress Notes (Signed)
09/21/2020 12:31 PM   Rachael Graham August 28, 1951 [id number]  Referring provider: Burnard Hawthorne, FNP [address] [place],  [place] [address] Chief Complaint  Patient presents with  . Nocturia    HPI: Rachael Graham is a 69 y.o. female who presents today for evaluation and management of nocturia.    She had a telephone visit with Mable Paris, [place] on 09/08/2020. She noted nocturia for years. Patient reports nocturia more than 2 times a night. Nocturia was worse with fruit juice and caffeine. She was drinking 1 beer at dinner along with half a glass of water. She noted that she does snore.   She had very rare stress incontinence and she does not wear panty liners. Denied incomplete bladder emptying. She denied UTI. Denied anything protruding from vagina.   She reports urgency, frequency and leakage. She has nocturia x 2-4. She feels like symptoms are worsening. She feels like she is emptying her bladder. Denies toilet mapping. Denies accidents and she is not wearing any pads/diapers.   Bowels are normal.   Denies any vaginal bugling.   She does not drink water. She likes flavored water, tea and 1 beer daily. She reports that she is not big on drinking. She goes to sleep at 9 PM and is up by 5 AM.   PVR is 11 mL.    PMH: Past Medical History:  Diagnosis Date  . Arthritis    knee, s/p steroid injection Dr. Alvan Dame  . Chicken pox   . Colon polyp   . Trigger finger    Followed at [facility], Dr. Veronia Beets    Surgical History: Past Surgical History:  Procedure Laterality Date  . AUGMENTATION MAMMAPLASTY Bilateral 1988  . BREAST BIOPSY     bilateral, both benign  . KNEE ARTHROSCOPY    . TONSILLECTOMY AND ADENOIDECTOMY  1964  . TRIGGER FINGER RELEASE     Dr. Veronia Beets  . TUBAL LIGATION    . VAGINAL DELIVERY      Home Medications:  Allergies as of 09/21/2020   No Known Allergies     Medication List       Accurate as of September 21, 2020 11:59  PM. If you have any questions, ask your nurse or doctor.        calcium carbonate 500 MG chewable tablet Commonly known as: TUMS - dosed in mg elemental calcium Chew 1 tablet by mouth as needed for indigestion or heartburn.   ibuprofen 100 MG tablet Commonly known as: ADVIL Take 100 mg by mouth as needed for fever. Take three tablets daily as needed.   LORazepam 0.5 MG tablet Commonly known as: ATIVAN Take 1 tablet (0.5 mg total) by mouth 2 (two) times daily as needed for anxiety.   omeprazole 20 MG capsule Commonly known as: PRILOSEC Take 20 mg by mouth daily.       Allergies: No Known Allergies  Family History: Family History  Problem Relation Age of Onset  . Arthritis Mother   . Hyperlipidemia Mother   . Heart disease Mother   . Hypertension Mother   . Stroke Mother   . Arthritis Father   . Hyperlipidemia Father   . Heart disease Father   . Hypertension Father   . Parkinsonism Father   . Crohn's disease Sister   . Obesity Sister   . Arthritis Maternal Grandmother   . Cancer Maternal Grandmother        colon  . Arthritis Maternal Grandfather   .  Cancer Maternal Grandfather        colon  . Breast cancer Neg Hx     Social History:  reports that she has never smoked. She has never used smokeless tobacco. She reports current alcohol use. She reports that she does not use drugs.   Physical Exam: BP 137/82   Pulse 68   Ht 5\' 3"  (1.6 m)   Wt 128 lb (58.1 kg)   BMI 22.67 kg/m   Constitutional:  Alert and oriented, No acute distress. HEENT: [place] AT, moist mucus membranes.  Trachea midline, no masses. Cardiovascular: No clubbing, cyanosis, or edema. Respiratory: Normal respiratory effort, no increased work of breathing. Skin: No rashes, bruises or suspicious lesions. Neurologic: Grossly intact, no focal deficits, moving all 4 extremities. Psychiatric: Normal mood and affect.  Laboratory Data:  Lab Results  Component Value Date   CREATININE 0.61 02/24/2020      Urinalysis Negative  Pertinent Imaging: Results for orders placed or performed in visit on 09/21/20  BLADDER SCAN AMB NON-IMAGING  Result Value Ref Range   Scan Result 11 ml      Assessment & Plan:    1. Urinary urgency/Nocturia  UA is negative.   Adequate bladder emptying  She is otherwise exceedingly healthy but sufficiently bothered by her symptoms to want to pursue intervention for this for quality of life.  We discussed that at this point, she does have both daytime and nighttime symptoms which seems to be most bothered by nighttime due to sleep interruption.  We discussed behavioral modification at length.  In addition to the above, we will treat her for overactivity and see if this is effective in treating both.  She was given samples of Myrbetriq 25 mg for a month and advised to send a MyChart message at the end of the month to let us know if this was effective or not.  We will decide if and how to reevaluate thereafter.  -Given 4 Myrbetriq 25 mg daily, # 28 samples; I have advised the patient of the side effects of Myrbetriq, such as: elevation in BP, urinary retention and/or HA   -Patient will contact clinic via Mychart to inform us on how well Mrybetriq has improved symptoms in 1 month.   2. Stress incontinence  Symptoms are mild and not bothersome at this time.    [facility] [address], [place], [place] [address] [phone]  I, Selena Batten, am acting as a scribe for Dr. Hollice Espy.  I have reviewed the above documentation for accuracy and completeness, and I agree with the above.   Hollice Espy, MD

## 2020-09-21 ENCOUNTER — Ambulatory Visit: Payer: Medicare HMO | Admitting: Urology

## 2020-09-21 ENCOUNTER — Other Ambulatory Visit: Payer: Self-pay

## 2020-09-21 ENCOUNTER — Encounter: Payer: Self-pay | Admitting: Urology

## 2020-09-21 VITALS — BP 137/82 | HR 68 | Ht 63.0 in | Wt 128.0 lb

## 2020-09-21 DIAGNOSIS — R351 Nocturia: Secondary | ICD-10-CM

## 2020-09-21 LAB — BLADDER SCAN AMB NON-IMAGING: Scan Result: 11

## 2020-09-22 LAB — MICROSCOPIC EXAMINATION
Bacteria, UA: NONE SEEN
Epithelial Cells (non renal): NONE SEEN /hpf (ref 0–10)

## 2020-09-22 LAB — URINALYSIS, COMPLETE
Bilirubin, UA: NEGATIVE
Glucose, UA: NEGATIVE
Ketones, UA: NEGATIVE
Leukocytes,UA: NEGATIVE
Nitrite, UA: NEGATIVE
Protein,UA: NEGATIVE
RBC, UA: NEGATIVE
Specific Gravity, UA: 1.01 (ref 1.005–1.030)
Urobilinogen, Ur: 0.2 mg/dL (ref 0.2–1.0)
pH, UA: 6 (ref 5.0–7.5)

## 2020-10-11 ENCOUNTER — Other Ambulatory Visit: Payer: Self-pay | Admitting: Obstetrics & Gynecology

## 2020-10-11 ENCOUNTER — Encounter: Payer: Self-pay | Admitting: Urology

## 2020-10-11 ENCOUNTER — Other Ambulatory Visit: Payer: Self-pay

## 2020-10-11 DIAGNOSIS — Z1231 Encounter for screening mammogram for malignant neoplasm of breast: Secondary | ICD-10-CM

## 2020-10-11 MED ORDER — MIRABEGRON ER 25 MG PO TB24: 25.0000 mg | ORAL_TABLET | Freq: Every day | ORAL | 11 refills | Status: DC

## 2020-10-13 ENCOUNTER — Other Ambulatory Visit: Payer: Self-pay

## 2020-10-13 MED ORDER — OXYBUTYNIN CHLORIDE ER 10 MG PO TB24: 10.0000 mg | ORAL_TABLET | Freq: Every day | ORAL | 1 refills | Status: DC

## 2020-11-03 ENCOUNTER — Other Ambulatory Visit: Payer: Self-pay

## 2020-11-03 ENCOUNTER — Ambulatory Visit: Payer: Medicare HMO | Admitting: Urology

## 2020-11-03 ENCOUNTER — Encounter: Payer: Self-pay | Admitting: Urology

## 2020-11-03 VITALS — BP 134/80 | HR 87

## 2020-11-03 DIAGNOSIS — N3281 Overactive bladder: Secondary | ICD-10-CM

## 2020-11-03 DIAGNOSIS — K5903 Drug induced constipation: Secondary | ICD-10-CM

## 2020-11-03 NOTE — Progress Notes (Signed)
11/03/2020 2:32 PM   Rachael Graham July 31, 1951 [id number]  Referring provider: Burnard Hawthorne, FNP [address] [place],  [place] [address]  Chief Complaint  Patient presents with   Follow-up    HPI: 69 year old female who returns today for routine follow-up.  She was initially seen and evaluated with worsening urinary frequency and urgency.  She was given samples of Myrbetriq 25 mg which improved her urinary symptoms by 50%.  She was mostly improved during the daytime but also did better at night as well.  She did have some mild BP elevation.  Ultimately, she was not able to afford the co-pay for this prescription.  Alternative of Ditropan 10 mg XL was sent.  She does struggle with constipation.  This is a chronic issue for her but seems to be exacerbated since starting the oxybutynin.  She has been taking fiber Gummies which is helped somewhat.  She is to have a daily whorl formed stool but now will go to the bathroom several times a day and have smaller rounder stools.  She does complain a little bit of dry mouth but this is manageable.  Overall, her symptoms are extremely well controlled.  She is very pleased with her result.  She now has time to get to the bathroom on time.  Her urinary frequency both daytime and nighttime are dramatically improved.  She would like to continue this medication.  PMH: Past Medical History:  Diagnosis Date   Arthritis    knee, s/p steroid injection Dr. Alvan Dame   Chicken pox    Colon polyp    Trigger finger    Followed at [facility], Dr. Veronia Beets    Surgical History: Past Surgical History:  Procedure Laterality Date   AUGMENTATION MAMMAPLASTY Bilateral 1988   BREAST BIOPSY     bilateral, both benign   KNEE ARTHROSCOPY     TONSILLECTOMY AND ADENOIDECTOMY  1964   TRIGGER FINGER RELEASE     Dr. [place] Medications:  Allergies as of 11/03/2020   No  Known Allergies     Medication List       Accurate as of November 03, 2020  2:32 PM. If you have any questions, ask your nurse or doctor.        STOP taking these medications   mirabegron ER 25 MG Tb24 tablet Commonly known as: MYRBETRIQ Stopped by: Hollice Espy, MD     TAKE these medications   calcium carbonate 500 MG chewable tablet Commonly known as: TUMS - dosed in mg elemental calcium Chew 1 tablet by mouth as needed for indigestion or heartburn.   ibuprofen 100 MG tablet Commonly known as: ADVIL Take 100 mg by mouth as needed for fever. Take three tablets daily as needed.   LORazepam 0.5 MG tablet Commonly known as: ATIVAN Take 1 tablet (0.5 mg total) by mouth 2 (two) times daily as needed for anxiety.   omeprazole 20 MG capsule Commonly known as: PRILOSEC Take 20 mg by mouth daily.   oxybutynin 10 MG 24 hr tablet Commonly known as: DITROPAN-XL Take 1 tablet (10 mg total) by mouth daily.       Allergies: No Known Allergies  Family History: Family History  Problem Relation Age of Onset   Arthritis Mother    Hyperlipidemia Mother    Heart disease Mother    Hypertension Mother    Stroke Mother  Arthritis Father    Hyperlipidemia Father    Heart disease Father    Hypertension Father    Parkinsonism Father    Crohn's disease Sister    Obesity Sister    Arthritis Maternal Grandmother    Cancer Maternal Grandmother        colon   Arthritis Maternal Grandfather    Cancer Maternal Grandfather        colon   Breast cancer Neg Hx     Social History:  reports that she has never smoked. She has never used smokeless tobacco. She reports current alcohol use. She reports that she does not use drugs.   Physical Exam: BP 134/80    Pulse 87   Constitutional:  Alert and oriented, No acute distress. HEENT: [place] AT, moist mucus membranes.  Trachea midline, no masses. Cardiovascular: No clubbing, cyanosis, or edema. Respiratory: Normal  respiratory effort, no increased work of breathing. Skin: No rashes, bruises or suspicious lesions. Neurologic: Grossly intact, no focal deficits, moving all 4 extremities. Psychiatric: Normal mood and affect.  Laboratory Data: Lab Results  Component Value Date   WBC 7.6 02/24/2020   HGB 13.1 02/24/2020   HCT 38.7 02/24/2020   MCV 87.1 02/24/2020   PLT 251.0 02/24/2020    Lab Results  Component Value Date   CREATININE 0.61 02/24/2020    Lab Results  Component Value Date   HGBA1C 5.9 08/06/2017    Assessment & Plan:    1. OAB (overactive bladder) Myrbetriq effective but not affordable  She is done well with oxybutynin 10 mg in terms of urinary symptoms.  PVR today is minimal  She like to continue this medication  2. Drug induced constipation Lengthy discussion today about drug-induced constipation.  It is very important that she continues to have daily soft formed stools.  She should continue fiber supplement and use MiraLAX as needed to achieve this   Follow-up annually or with primary care if she is doing well  Hollice Espy, MD  [facility] [address] [place], [place] [address] [phone]

## 2020-11-15 ENCOUNTER — Other Ambulatory Visit: Payer: Self-pay

## 2020-11-15 ENCOUNTER — Encounter: Payer: Self-pay | Admitting: Urology

## 2020-11-15 MED ORDER — OXYBUTYNIN CHLORIDE ER 10 MG PO TB24
10.0000 mg | ORAL_TABLET | Freq: Every day | ORAL | 3 refills | Status: DC
Start: 2020-11-15 — End: 2021-09-12

## 2020-11-15 MED ORDER — OXYBUTYNIN CHLORIDE ER 10 MG PO TB24
10.0000 mg | ORAL_TABLET | Freq: Every day | ORAL | 3 refills | Status: DC
Start: 2020-11-15 — End: 2020-11-15

## 2020-12-08 ENCOUNTER — Other Ambulatory Visit: Payer: Self-pay

## 2020-12-08 ENCOUNTER — Ambulatory Visit
Admission: RE | Admit: 2020-12-08 | Discharge: 2020-12-08 | Disposition: A | Discharge status: Home / Self Care | Payer: Medicare HMO | Source: Ambulatory Visit | Attending: Obstetrics & Gynecology | Admitting: Obstetrics & Gynecology

## 2020-12-08 DIAGNOSIS — Z1231 Encounter for screening mammogram for malignant neoplasm of breast: Secondary | ICD-10-CM

## 2021-02-01 ENCOUNTER — Encounter: Payer: Self-pay | Admitting: Gastroenterology

## 2021-02-01 ENCOUNTER — Ambulatory Visit (INDEPENDENT_AMBULATORY_CARE_PROVIDER_SITE_OTHER): Payer: Medicare HMO | Admitting: Gastroenterology

## 2021-02-01 ENCOUNTER — Other Ambulatory Visit: Payer: Self-pay

## 2021-02-01 DIAGNOSIS — K219 Gastro-esophageal reflux disease without esophagitis: Secondary | ICD-10-CM

## 2021-02-01 DIAGNOSIS — Z1211 Encounter for screening for malignant neoplasm of colon: Secondary | ICD-10-CM

## 2021-02-01 NOTE — Progress Notes (Signed)
Primary Care Physician: Burnard Hawthorne, FNP  Primary Gastroenterologist:  Dr. Lucilla Lame  Chief Complaint  Patient presents with  . Gastroesophageal Reflux    HPI: Rachael Graham is a 70 y.o. female here after being seen in the past for abdominal discomfort and a transient increase in lipase.  The patient was set here for evaluation of esophageal pain with reflux.  The patient had been seen by urology recently with some recommendations on how to treat her constipation after starting medication that caused the constipation.  It appears that the patient's last colonoscopy was in 2012 and at that time she had a very bad experience and at our last visit she had expressed her desire never to have a colonoscopy again  The patient now reports that she was having significant reflux symptoms with burning in her throat and her mouth with ear pain resulting from her reflux.  The patient was taking pantoprazole and states that she also raised the head of her bed which both improved her symptoms greatly.  The patient states that she takes Lansoprazole in the morning and usually has her symptoms are at night.  There is no report of any unexplained weight loss fevers chills nausea vomiting black stools or bloody stools.    Past Medical History:  Diagnosis Date  . Arthritis    knee, s/p steroid injection Dr. Alvan Dame  . Chicken pox   . Colon polyp   . Trigger finger    Followed at [facility], Dr. Veronia Beets    Current Outpatient Medications  Medication Sig Dispense Refill  . calcium carbonate (TUMS - DOSED IN MG ELEMENTAL CALCIUM) 500 MG chewable tablet Chew 1 tablet by mouth as needed for indigestion or heartburn.    Marland Kitchen ibuprofen (ADVIL) 100 MG tablet Take 100 mg by mouth as needed for fever. Take three tablets daily as needed.    Marland Kitchen LORazepam (ATIVAN) 0.5 MG tablet Take 1 tablet (0.5 mg total) by mouth 2 (two) times daily as needed for anxiety. 30 tablet 1  . omeprazole (PRILOSEC) 20 MG  capsule Take 20 mg by mouth daily.    Marland Kitchen oxybutynin (DITROPAN-XL) 10 MG 24 hr tablet Take 1 tablet (10 mg total) by mouth daily. 90 tablet 3   No current facility-administered medications for this visit.    Allergies as of 02/01/2021  . (No Known Allergies)    ROS:  General: Negative for anorexia, weight loss, fever, chills, fatigue, weakness. ENT: Negative for hoarseness, difficulty swallowing , nasal congestion. CV: Negative for chest pain, angina, palpitations, dyspnea on exertion, peripheral edema.  Respiratory: Negative for dyspnea at rest, dyspnea on exertion, cough, sputum, wheezing.  GI: See history of present illness. GU:  Negative for dysuria, hematuria, urinary incontinence, urinary frequency, nocturnal urination.  Endo: Negative for unusual weight change.    Physical Examination:   There were no vitals taken for this visit.  General: Well-nourished, well-developed in no acute distress.  Eyes: No icterus. Conjunctivae pink. Lungs: Clear to auscultation bilaterally. Non-labored. Heart: Regular rate and rhythm, no murmurs rubs or gallops.  Abdomen: Bowel sounds are normal, nontender, nondistended, no hepatosplenomegaly or masses, no abdominal bruits or hernia , no rebound or guarding.   Extremities: No lower extremity edema. No clubbing or deformities. Neuro: Alert and oriented x 3.  Grossly intact. Skin: Warm and dry, no jaundice.   Psych: Alert and cooperative, normal mood and affect.  Labs:    Imaging Studies: No results found.  Assessment and Plan:  Rachael Graham is a 70 y.o. y/o female Who comes in with a report of GERD that has been helped by her raising the head of her bed and also with her taking lens up result.  The patient has been told to start taking her lansoprazole in the evenings to that she has better acid suppression while she is lying down at night.  The patient has also agreed to undergo a colonoscopy.  The patient will be set up for a colonoscopy  for screening purposes.     Lucilla Lame, MD. Marval Regal    Note: This dictation was prepared with Dragon dictation along with smaller phrase technology. Any transcriptional errors that result from this process are unintentional.

## 2021-02-16 ENCOUNTER — Other Ambulatory Visit: Payer: Self-pay

## 2021-02-16 ENCOUNTER — Encounter: Payer: Self-pay | Admitting: Gastroenterology

## 2021-02-17 ENCOUNTER — Other Ambulatory Visit
Admission: RE | Admit: 2021-02-17 | Discharge: 2021-02-17 | Disposition: A | Discharge status: Home / Self Care | Payer: Medicare HMO | Source: Ambulatory Visit | Attending: Gastroenterology | Admitting: Gastroenterology

## 2021-02-17 DIAGNOSIS — Z20822 Contact with and (suspected) exposure to covid-19: Secondary | ICD-10-CM | POA: Insufficient documentation

## 2021-02-17 DIAGNOSIS — Z01812 Encounter for preprocedural laboratory examination: Secondary | ICD-10-CM | POA: Insufficient documentation

## 2021-02-17 LAB — SARS CORONAVIRUS 2 (TAT 6-24 HRS): SARS Coronavirus 2: NEGATIVE

## 2021-02-18 NOTE — Discharge Instructions (Signed)

## 2021-02-21 ENCOUNTER — Encounter
Admission: RE | Disposition: A | Discharge status: Home / Self Care | Payer: Self-pay | Source: Home / Self Care | Attending: Gastroenterology

## 2021-02-21 ENCOUNTER — Ambulatory Visit
Admission: RE | Admit: 2021-02-21 | Discharge: 2021-02-21 | Disposition: A | Discharge status: Home / Self Care | Payer: Medicare HMO | Attending: Gastroenterology | Admitting: Gastroenterology

## 2021-02-21 ENCOUNTER — Ambulatory Visit: Payer: Medicare HMO | Admitting: Anesthesiology

## 2021-02-21 ENCOUNTER — Other Ambulatory Visit: Payer: Self-pay

## 2021-02-21 ENCOUNTER — Encounter: Payer: Self-pay | Admitting: Gastroenterology

## 2021-02-21 DIAGNOSIS — Z1211 Encounter for screening for malignant neoplasm of colon: Secondary | ICD-10-CM | POA: Diagnosis not present

## 2021-02-21 DIAGNOSIS — Z0000 Encounter for general adult medical examination without abnormal findings: Secondary | ICD-10-CM

## 2021-02-21 DIAGNOSIS — D125 Benign neoplasm of sigmoid colon: Secondary | ICD-10-CM | POA: Insufficient documentation

## 2021-02-21 DIAGNOSIS — Z87891 Personal history of nicotine dependence: Secondary | ICD-10-CM | POA: Diagnosis not present

## 2021-02-21 DIAGNOSIS — K640 First degree hemorrhoids: Secondary | ICD-10-CM | POA: Diagnosis not present

## 2021-02-21 DIAGNOSIS — K635 Polyp of colon: Secondary | ICD-10-CM

## 2021-02-21 HISTORY — DX: Gastro-esophageal reflux disease without esophagitis: K21.9

## 2021-02-21 HISTORY — PX: POLYPECTOMY: SHX5525

## 2021-02-21 HISTORY — DX: Nausea with vomiting, unspecified: R11.2

## 2021-02-21 HISTORY — PX: COLONOSCOPY WITH PROPOFOL: SHX5780

## 2021-02-21 HISTORY — DX: Nausea with vomiting, unspecified: Z98.890

## 2021-02-21 HISTORY — DX: Other specified postprocedural states: Z98.890

## 2021-02-21 SURGERY — COLONOSCOPY WITH PROPOFOL
Anesthesia: General | Site: Rectum

## 2021-02-21 MED ORDER — STERILE WATER FOR IRRIGATION IR SOLN: Status: DC | PRN

## 2021-02-21 MED ORDER — PROPOFOL 10 MG/ML IV BOLUS
INTRAVENOUS | Status: DC | PRN
  Administered 2021-02-21: 50 mg via INTRAVENOUS
  Administered 2021-02-21: 100 mg via INTRAVENOUS
  Administered 2021-02-21 (×2): 50 mg via INTRAVENOUS

## 2021-02-21 MED ORDER — LACTATED RINGERS IV SOLN: INTRAVENOUS | Status: DC

## 2021-02-21 MED ORDER — LIDOCAINE HCL (CARDIAC) PF 100 MG/5ML IV SOSY
PREFILLED_SYRINGE | INTRAVENOUS | Status: DC | PRN
  Administered 2021-02-21: 30 mg via INTRAVENOUS

## 2021-02-21 MED ORDER — SODIUM CHLORIDE 0.9 % IV SOLN: INTRAVENOUS | Status: DC

## 2021-02-21 SURGICAL SUPPLY — 8 items
GOWN CVR UNV OPN BCK APRN NK (MISCELLANEOUS) ×2 IMPLANT
GOWN ISOL THUMB LOOP REG UNIV (MISCELLANEOUS) ×4
KIT PRC NS LF DISP ENDO (KITS) ×1 IMPLANT
KIT PROCEDURE OLYMPUS (KITS) ×2
MANIFOLD NEPTUNE II (INSTRUMENTS) ×2 IMPLANT
SNARE COLD EXACTO (MISCELLANEOUS) ×2 IMPLANT
TRAP ETRAP POLY (MISCELLANEOUS) ×2 IMPLANT
WATER STERILE IRR 250ML POUR (IV SOLUTION) ×2 IMPLANT

## 2021-02-21 NOTE — H&P (Signed)
Rachael Lame, MD [facility] [address], [place], [place] [address] Phone: [phone] Fax : [phone]  Primary Care Physician:  Burnard Hawthorne, FNP Primary Gastroenterologist:  Dr. Allen Norris  Pre-Procedure History & Physical: HPI:  Rachael Graham is a 70 y.o. female is here for a screening colonoscopy.   Past Medical History:  Diagnosis Date  . Arthritis    knee, s/p steroid injection Dr. Alvan Dame  . Chicken pox   . Colon polyp   . GERD (gastroesophageal reflux disease)   . PONV (postoperative nausea and vomiting)    after knee surgery  . Trigger finger    Followed at [facility], Dr. Veronia Beets  . Vertigo 09/2017   1 episode    Past Surgical History:  Procedure Laterality Date  . AUGMENTATION MAMMAPLASTY Bilateral 1988  . BREAST BIOPSY     bilateral, both benign  . KNEE ARTHROSCOPY    . TONSILLECTOMY AND ADENOIDECTOMY  1964  . TRIGGER FINGER RELEASE     Dr. Veronia Beets  . TUBAL LIGATION    . VAGINAL DELIVERY      Prior to Admission medications   Medication Sig Start Date End Date Taking? Authorizing Provider  calcium carbonate (TUMS - DOSED IN MG ELEMENTAL CALCIUM) 500 MG chewable tablet Chew 1 tablet by mouth as needed for indigestion or heartburn.   Yes [provider]  FIBER ADULT GUMMIES PO Take by mouth.   Yes [provider]  ibuprofen (ADVIL) 100 MG tablet Take 100 mg by mouth as needed for fever. Take three tablets daily as needed.   Yes [provider]  LORazepam (ATIVAN) 0.5 MG tablet Take 1 tablet (0.5 mg total) by mouth 2 (two) times daily as needed for anxiety. 09/08/20  Yes Burnard Hawthorne, FNP  omeprazole (PRILOSEC) 20 MG capsule Take 20 mg by mouth daily.   Yes [provider]  oxybutynin (DITROPAN-XL) 10 MG 24 hr tablet Take 1 tablet (10 mg total) by mouth daily. Patient not taking: Reported on 02/16/2021 11/15/20   Hollice Espy, MD    Allergies as of 02/01/2021  . (No Known Allergies)    Family  History  Problem Relation Age of Onset  . Arthritis Mother   . Hyperlipidemia Mother   . Heart disease Mother   . Hypertension Mother   . Stroke Mother   . Arthritis Father   . Hyperlipidemia Father   . Heart disease Father   . Hypertension Father   . Parkinsonism Father   . Crohn's disease Sister   . Obesity Sister   . Arthritis Maternal Grandmother   . Cancer Maternal Grandmother        colon  . Arthritis Maternal Grandfather   . Cancer Maternal Grandfather        colon  . Breast cancer Neg Hx     Social History   Socioeconomic History  . Marital status: Married    Spouse name: Not on file  . Number of children: Not on file  . Years of education: Not on file  . Highest education level: Not on file  Occupational History  . Not on file  Tobacco Use  . Smoking status: Former Smoker    Types: Cigarettes    Quit date: 1978    Years since quitting: 44.1  . Smokeless tobacco: Never Used  Vaping Use  . Vaping Use: Never used  Substance and Sexual Activity  . Alcohol use: Yes    Alcohol/week: 6.0 standard drinks  Types: 6 Cans of beer per week    Comment: occaisionaly  . Drug use: No  . Sexual activity: Not on file  Other Topics Concern  . Not on file  Social History Narrative   Lives in [place]. From [place]. Two children in [place].      Diet - Regular   Exercise - tennis   Social Determinants of Health   Financial Resource Strain: Low Risk   . Difficulty of Paying Living Expenses: Not hard at all  Food Insecurity: No Food Insecurity  . Worried About Charity fundraiser in the Last Year: Never true  . Ran Out of Food in the Last Year: Never true  Transportation Needs: No Transportation Needs  . Lack of Transportation (Medical): No  . Lack of Transportation (Non-Medical): No  Physical Activity: Sufficiently Active  . Days of Exercise per Week: 5 days  . Minutes of Exercise per Session: 30 min  Stress: No Stress Concern Present  . Feeling of Stress : Not  at all  Social Connections: Unknown  . Frequency of Communication with Friends and Family: More than three times a week  . Frequency of Social Gatherings with Friends and Family: Not on file  . Attends Religious Services: Not on file  . Active Member of Clubs or Organizations: Yes  . Attends Archivist Meetings: Not on file  . Marital Status: Married  Human resources officer Violence: Not At Risk  . Fear of Current or Ex-Partner: No  . Emotionally Abused: No  . Physically Abused: No  . Sexually Abused: No    Review of Systems: See HPI, otherwise negative ROS  Physical Exam: Ht 5\' 3"  (1.6 m)   Wt 55.8 kg   BMI 21.79 kg/m  General:   Alert,  pleasant and cooperative in NAD Head:  Normocephalic and atraumatic. Neck:  Supple; no masses or thyromegaly. Lungs:  Clear throughout to auscultation.    Heart:  Regular rate and rhythm. Abdomen:  Soft, nontender and nondistended. Normal bowel sounds, without guarding, and without rebound.   Neurologic:  Alert and  oriented x4;  grossly normal neurologically.  Impression/Plan: Rachael Graham is now here to undergo a screening colonoscopy.  Risks, benefits, and alternatives regarding colonoscopy have been reviewed with the patient.  Questions have been answered.  All parties agreeable.

## 2021-02-21 NOTE — Anesthesia Postprocedure Evaluation (Signed)
Anesthesia Post Note  Patient: Rachael Graham  Procedure(s) Performed: COLONOSCOPY WITH BIOPSY (N/A Rectum) POLYPECTOMY (N/A Rectum)     Patient location during evaluation: PACU Anesthesia Type: General Level of consciousness: awake and alert Pain management: pain level controlled Vital Signs Assessment: post-procedure vital signs reviewed and stable Respiratory status: spontaneous breathing and nonlabored ventilation Cardiovascular status: blood pressure returned to baseline Postop Assessment: no apparent nausea or vomiting Anesthetic complications: no   No complications documented.  Kyzen Horn [employer]

## 2021-02-21 NOTE — Transfer of Care (Signed)
Immediate Anesthesia Transfer of Care Note  Patient: Rachael Graham  Procedure(s) Performed: COLONOSCOPY WITH BIOPSY (N/A Rectum) POLYPECTOMY (N/A Rectum)  Patient Location: PACU  Anesthesia Type: General  Level of Consciousness: awake, alert  and patient cooperative  Airway and Oxygen Therapy: Patient Spontanous Breathing and Patient connected to supplemental oxygen  Post-op Assessment: Post-op Vital signs reviewed, Patient's Cardiovascular Status Stable, Respiratory Function Stable, Patent Airway and No signs of Nausea or vomiting  Post-op Vital Signs: Reviewed and stable  Complications: No complications documented.

## 2021-02-21 NOTE — Anesthesia Preprocedure Evaluation (Signed)
Anesthesia Evaluation  Patient identified by MRN, date of birth, ID band Patient awake    Reviewed: Allergy & Precautions, NPO status , Patient's Chart, lab work & pertinent test results  History of Anesthesia Complications (+) PONV  Airway Mallampati: II  TM Distance: >3 FB Neck ROM: Full    Dental no notable dental hx.    Pulmonary former smoker,    Pulmonary exam normal        Cardiovascular negative cardio ROS Normal cardiovascular exam     Neuro/Psych Anxiety negative neurological ROS     GI/Hepatic Neg liver ROS, GERD  Controlled,  Endo/Other  negative endocrine ROS  Renal/GU negative Renal ROS     Musculoskeletal  (+) Arthritis , Osteoarthritis,    Abdominal Normal abdominal exam  (+)   Peds  Hematology negative hematology ROS (+)   Anesthesia Other Findings   Reproductive/Obstetrics                             Anesthesia Physical Anesthesia Plan  ASA: II  Anesthesia Plan: General   Post-op Pain Management:    Induction: Intravenous  PONV Risk Score and Plan: 4 or greater and Propofol infusion, TIVA and Treatment may vary due to age or medical condition  Airway Management Planned: Natural Airway  Additional Equipment: None  Intra-op Plan:   Post-operative Plan:   Informed Consent: I have reviewed the patients History and Physical, chart, labs and discussed the procedure including the risks, benefits and alternatives for the proposed anesthesia with the patient or authorized representative who has indicated his/her understanding and acceptance.     Dental advisory given  Plan Discussed with: CRNA  Anesthesia Plan Comments:         Anesthesia Quick Evaluation

## 2021-02-21 NOTE — Op Note (Signed)
[facility] Patient Name: Rachael Graham Procedure Date: 02/21/2021 8:54 AM MRN: [id number] Account #: [id number] Date of Birth: [birth year] Admit Type: Outpatient Age: 70 Room: [facility] OR ROOM 01 Gender: Female Note Status: Finalized Procedure:             Colonoscopy Indications:           Screening for colorectal malignant neoplasm Providers:             Lucilla Lame MD, MD Referring MD:          Yvetta Coder. Arnett (Referring MD) Medicines:             Propofol per Anesthesia Complications:         No immediate complications. Procedure:             Pre-Anesthesia Assessment:                        - Prior to the procedure, a History and Physical was                         performed, and patient medications and allergies were                         reviewed. The patient's tolerance of previous                         anesthesia was also reviewed. The risks and benefits                         of the procedure and the sedation options and risks                         were discussed with the patient. All questions were                         answered, and informed consent was obtained. Prior                         Anticoagulants: The patient has taken no previous                         anticoagulant or antiplatelet agents. ASA Grade                         Assessment: II - A patient with mild systemic disease.                         After reviewing the risks and benefits, the patient                         was deemed in satisfactory condition to undergo the                         procedure.                        After obtaining informed consent, the colonoscope was  passed under direct vision. Throughout the procedure,                         the patient's blood pressure, pulse, and oxygen                         saturations were monitored continuously. The was                         introduced through the anus and  advanced to the the                         cecum, identified by appendiceal orifice and ileocecal                         valve. The colonoscopy was performed without                         difficulty. The patient tolerated the procedure well.                         The quality of the bowel preparation was excellent. Findings:      The perianal and digital rectal examinations were normal.      A 5 mm polyp was found in the sigmoid colon. The polyp was sessile. The       polyp was removed with a cold snare. Resection and retrieval were       complete.      Non-bleeding internal hemorrhoids were found during retroflexion. The       hemorrhoids were Grade I (internal hemorrhoids that do not prolapse). Impression:            - One 5 mm polyp in the sigmoid colon, removed with a                         cold snare. Resected and retrieved.                        - Non-bleeding internal hemorrhoids. Recommendation:        - Repeat colonoscopy in 7 years if polyp adenoma and                         10 years if hyperplastic Procedure Code(s):     --- Professional ---                        [phone], Colonoscopy, flexible; with removal of                         tumor(s), polyp(s), or other lesion(s) by snare                         technique Diagnosis Code(s):     --- Professional ---                        Z12.11, Encounter for screening for malignant neoplasm                         of colon  K63.5, Polyp of colon CPT copyright 2019 American [employer] Association. All rights reserved. The codes documented in this report are preliminary and upon coder review may  be revised to meet current compliance requirements. Lucilla Lame MD, MD 02/21/2021 9:22:09 AM This report has been signed electronically. Number of Addenda: 0 Note Initiated On: 02/21/2021 8:54 AM Scope Withdrawal Time: 0 hours 8 minutes 12 seconds  Total Procedure Duration: 0 hours 16 minutes 37 seconds  Estimated  Blood Loss:  Estimated blood loss: none.      [facility]

## 2021-02-23 LAB — SURGICAL PATHOLOGY

## 2021-02-24 ENCOUNTER — Encounter: Payer: Self-pay | Admitting: Gastroenterology

## 2021-03-30 ENCOUNTER — Ambulatory Visit: Payer: Medicare HMO

## 2021-08-19 ENCOUNTER — Encounter: Payer: Self-pay | Admitting: Family

## 2021-08-23 ENCOUNTER — Encounter: Payer: Self-pay | Admitting: Family

## 2021-08-23 DIAGNOSIS — U071 COVID-19: Secondary | ICD-10-CM

## 2021-08-24 ENCOUNTER — Encounter: Payer: Self-pay | Admitting: Family

## 2021-08-24 MED ORDER — MOLNUPIRAVIR EUA 200MG CAPSULE: 4.0000 | ORAL_CAPSULE | Freq: Two times a day (BID) | ORAL | 0 refills | Status: AC

## 2021-08-24 NOTE — Telephone Encounter (Signed)
Pt states that she is worse today and wants to know if [name] would go ahead and call in [place]. Please advise

## 2021-08-24 NOTE — Telephone Encounter (Signed)
Spoke with patient Covid positive Symptoms started 4 days ago.  Sa02 94%  Complains of cough, congestion, low grade fever, myalgia.  No sob, wheezing.  Fully vaccinated.   No h/o CKD  She is interested in antiviral.    Plan:   Discussed molnupiravir.I have counseled on lacking long term safely and effectiveness data of medication,molnupiravir.  Explained EUA for molnupiravir. Criteria met for consideration of  Molnupiravir :  covid positive, patient older than 68 years old, started within 5 days of symptom onset and risk factor for severe disease include: age > 53.   Counseled on adverse effects including nausea, dizziness, diarrhea.  Patient is most comfortable and desires to start [place] which I have sent to pharmacy. Advised patient to continue monitoring for shortness of breath, cough and if any progression of symptoms to let me know right away or present to emergency room.   Patient verbalized understanding of all

## 2021-08-26 ENCOUNTER — Other Ambulatory Visit: Payer: Self-pay | Admitting: Family

## 2021-08-26 DIAGNOSIS — U071 COVID-19: Secondary | ICD-10-CM

## 2021-08-26 NOTE — Progress Notes (Signed)
close

## 2021-09-12 ENCOUNTER — Other Ambulatory Visit: Payer: Self-pay

## 2021-09-12 ENCOUNTER — Encounter: Payer: Self-pay | Admitting: Family

## 2021-09-12 ENCOUNTER — Ambulatory Visit (INDEPENDENT_AMBULATORY_CARE_PROVIDER_SITE_OTHER): Payer: Medicare HMO | Admitting: Family

## 2021-09-12 DIAGNOSIS — F419 Anxiety disorder, unspecified: Secondary | ICD-10-CM | POA: Diagnosis not present

## 2021-09-12 DIAGNOSIS — R69 Illness, unspecified: Secondary | ICD-10-CM | POA: Diagnosis not present

## 2021-09-12 MED ORDER — LORAZEPAM 0.5 MG PO TABS: 0.5000 mg | ORAL_TABLET | Freq: Two times a day (BID) | ORAL | 1 refills | Status: DC | PRN

## 2021-09-12 NOTE — Progress Notes (Signed)
Subjective:    Patient ID: Rachael Graham, female    DOB: [birth year], 70 y.o.   MRN: [id number]  CC: Rachael Graham is a 70 y.o. female who presents today for follow up.   HPI: Here for medication refill.  Feels well No complaints  F/u Generalized anxiety disorder-she occasionally uses Ativan 1 mg with relief. Overall very well controlled.         HISTORY:  Past Medical History:  Diagnosis Date   Arthritis    knee, s/p steroid injection Dr. Alvan Dame   Chicken pox    Colon polyp    GERD (gastroesophageal reflux disease)    PONV (postoperative nausea and vomiting)    after knee surgery   Trigger finger    [facility], Dr. Veronia Beets   Vertigo 09/2017   1 episode   Past Surgical History:  Procedure Laterality Date   AUGMENTATION MAMMAPLASTY Bilateral 1988   BREAST BIOPSY     bilateral, both benign   COLONOSCOPY WITH PROPOFOL N/A 02/21/2021   Procedure: COLONOSCOPY WITH BIOPSY;  Surgeon: Lucilla Lame, MD;  Location: [place];  Service: Endoscopy;  Laterality: N/A;   KNEE ARTHROSCOPY     POLYPECTOMY N/A 02/21/2021   Procedure: POLYPECTOMY;  Surgeon: Lucilla Lame, MD;  Location: [place];  Service: Endoscopy;  Laterality: N/A;   TONSILLECTOMY AND ADENOIDECTOMY  1964   TRIGGER FINGER RELEASE     Dr. Veronia Beets   TUBAL LIGATION     VAGINAL DELIVERY     Family History  Problem Relation Age of Onset   Arthritis Mother    Hyperlipidemia Mother    Heart disease Mother    Hypertension Mother    Stroke Mother    Arthritis Father    Hyperlipidemia Father    Heart disease Father    Hypertension Father    Parkinsonism Father    Crohn's disease Sister    Obesity Sister    Arthritis Maternal Grandmother    Cancer Maternal Grandmother        colon   Arthritis Maternal Grandfather    Cancer Maternal Grandfather        colon   Breast cancer Neg Hx     Allergies: Patient has no known allergies. Current Outpatient Medications on File  Prior to Visit  Medication Sig Dispense Refill   calcium carbonate (TUMS - DOSED IN MG ELEMENTAL CALCIUM) 500 MG chewable tablet Chew 1 tablet by mouth as needed for indigestion or heartburn.     ibuprofen (ADVIL) 100 MG tablet Take 100 mg by mouth as needed for fever. Take three tablets daily as needed.     omeprazole (PRILOSEC) 20 MG capsule Take 20 mg by mouth daily.     No current facility-administered medications on file prior to visit.    Social History   Tobacco Use   Smoking status: Former    Types: Cigarettes    Quit date: 1978    Years since quitting: 44.7   Smokeless tobacco: Never  Vaping Use   Vaping Use: Never used  Substance Use Topics   Alcohol use: Yes    Alcohol/week: 6.0 standard drinks    Types: 6 Cans of beer per week    Comment: occaisionaly   Drug use: No    Review of Systems  Constitutional:  Negative for chills and fever.  Respiratory:  Negative for cough.   Cardiovascular:  Negative for chest pain and palpitations.  Gastrointestinal:  Negative for nausea and vomiting.  Objective:    BP 122/68 (BP Location: Left Arm, Patient Position: Sitting, Cuff Size: Normal)   Pulse 74   Temp 98.6 F (37 C) (Oral)   Ht '5\' 3"'$  (1.6 m)   Wt 130 lb 6.4 oz (59.1 kg)   SpO2 96%   BMI 23.10 kg/m  BP Readings from Last 3 Encounters:  09/12/21 122/68  02/21/21 (!) 93/59  11/03/20 134/80   Wt Readings from Last 3 Encounters:  09/12/21 130 lb 6.4 oz (59.1 kg)  02/21/21 121 lb 12.8 oz (55.2 kg)  09/21/20 128 lb (58.1 kg)    Physical Exam Vitals reviewed.  Constitutional:      Appearance: She is well-developed.  Eyes:     Conjunctiva/sclera: Conjunctivae normal.  Cardiovascular:     Rate and Rhythm: Normal rate and regular rhythm.     Pulses: Normal pulses.     Heart sounds: Normal heart sounds.  Pulmonary:     Effort: Pulmonary effort is normal.     Breath sounds: Normal breath sounds. No wheezing, rhonchi or rales.  Skin:    General: Skin is  warm and dry.  Neurological:     Mental Status: She is alert.  Psychiatric:        Speech: Speech normal.        Behavior: Behavior normal.        Thought Content: Thought content normal.       Assessment & Plan:   Problem List Items Addressed This Visit       Other   Anxiety    Excellent control.  Very rare use of Ativan 1 mg.  Refilled today I looked up patient on [place] Controlled Substances Reporting System PMP AWARE and saw no activity that raised concern of inappropriate use.        Relevant Medications   LORazepam (ATIVAN) 0.5 MG tablet   Other Relevant Orders   CBC with Differential/Platelet   Comprehensive metabolic panel   Hemoglobin A1c   Lipid panel   VITAMIN D 25 Hydroxy (Vit-D Deficiency, Fractures)   TSH     I have discontinued Rachael Graham's oxybutynin and FIBER ADULT GUMMIES PO. I am also having her maintain her omeprazole, calcium carbonate, ibuprofen, and LORazepam.   Meds ordered this encounter  Medications   LORazepam (ATIVAN) 0.5 MG tablet    Sig: Take 1 tablet (0.5 mg total) by mouth 2 (two) times daily as needed for anxiety.    Dispense:  30 tablet    Refill:  1    Order Specific Question:   Supervising Provider    Answer:   Crecencio Mc [[id number]]    Return precautions given.   Risks, benefits, and alternatives of the medications and treatment plan prescribed today were discussed, and patient expressed understanding.   Education regarding symptom management and diagnosis given to patient on AVS.  Continue to follow with Burnard Hawthorne, FNP for routine health maintenance.   Rachael Graham and I agreed with plan.   Mable Paris, FNP

## 2021-09-12 NOTE — Assessment & Plan Note (Signed)
Excellent control.  Very rare use of Ativan 1 mg.  Refilled today I looked up patient on [place] Controlled Substances Reporting System PMP AWARE and saw no activity that raised concern of inappropriate use.

## 2021-09-26 ENCOUNTER — Other Ambulatory Visit: Payer: Self-pay

## 2021-09-26 ENCOUNTER — Other Ambulatory Visit (INDEPENDENT_AMBULATORY_CARE_PROVIDER_SITE_OTHER): Payer: Medicare HMO

## 2021-09-26 DIAGNOSIS — F419 Anxiety disorder, unspecified: Secondary | ICD-10-CM

## 2021-09-26 DIAGNOSIS — R69 Illness, unspecified: Secondary | ICD-10-CM | POA: Diagnosis not present

## 2021-09-26 LAB — COMPREHENSIVE METABOLIC PANEL
ALT: 20 U/L (ref 0–35)
AST: 19 U/L (ref 0–37)
Albumin: 4.5 g/dL (ref 3.5–5.2)
Alkaline Phosphatase: 79 U/L (ref 39–117)
BUN: 14 mg/dL (ref 6–23)
CO2: 31 mEq/L (ref 19–32)
Calcium: 9.5 mg/dL (ref 8.4–10.5)
Chloride: 102 mEq/L (ref 96–112)
Creatinine, Ser: 0.7 mg/dL (ref 0.40–1.20)
GFR: 89.18 mL/min (ref >60.00)
Glucose, Bld: 87 mg/dL (ref 70–99)
Potassium: 4.5 mEq/L (ref 3.5–5.1)
Sodium: 141 mEq/L (ref 135–145)
Total Bilirubin: 1.6 mg/dL — ABNORMAL HIGH (ref 0.2–1.2)
Total Protein: 6.6 g/dL (ref 6.0–8.3)

## 2021-09-26 LAB — CBC WITH DIFFERENTIAL/PLATELET
Basophils Absolute: 0 10*3/uL (ref 0.0–0.1)
Basophils Relative: 0.7 % (ref 0.0–3.0)
Eosinophils Absolute: 0.1 10*3/uL (ref 0.0–0.7)
Eosinophils Relative: 2.5 % (ref 0.0–5.0)
HCT: 40.3 % (ref 36.0–46.0)
Hemoglobin: 13.6 g/dL (ref 12.0–15.0)
Lymphocytes Relative: 43.2 % (ref 12.0–46.0)
Lymphs Abs: 2.6 10*3/uL (ref 0.7–4.0)
MCHC: 33.7 g/dL (ref 30.0–36.0)
MCV: 86.8 fl (ref 78.0–100.0)
Monocytes Absolute: 0.6 10*3/uL (ref 0.1–1.0)
Monocytes Relative: 9.5 % (ref 3.0–12.0)
Neutro Abs: 2.7 10*3/uL (ref 1.4–7.7)
Neutrophils Relative %: 44.1 % (ref 43.0–77.0)
Platelets: 257 10*3/uL (ref 150.0–400.0)
RBC: 4.65 Mil/uL (ref 3.87–5.11)
RDW: 13.4 % (ref 11.5–15.5)
WBC: 6.1 10*3/uL (ref 4.0–10.5)

## 2021-09-26 LAB — LIPID PANEL
Cholesterol: 208 mg/dL — ABNORMAL HIGH (ref 0–200)
HDL: 67.1 mg/dL (ref >39.00)
LDL Cholesterol: 124 mg/dL — ABNORMAL HIGH (ref 0–99)
NonHDL: 140.81
Total CHOL/HDL Ratio: 3
Triglycerides: 82 mg/dL (ref 0.0–149.0)
VLDL: 16.4 mg/dL (ref 0.0–40.0)

## 2021-09-26 LAB — VITAMIN D 25 HYDROXY (VIT D DEFICIENCY, FRACTURES): VITD: 34.94 ng/mL (ref 30.00–100.00)

## 2021-09-26 LAB — HEMOGLOBIN A1C: Hgb A1c MFr Bld: 6 % (ref 4.6–6.5)

## 2021-09-26 LAB — TSH: TSH: 1.36 u[IU]/mL (ref 0.35–5.50)

## 2021-09-30 ENCOUNTER — Other Ambulatory Visit: Payer: Self-pay | Admitting: Family

## 2021-09-30 DIAGNOSIS — R17 Unspecified jaundice: Secondary | ICD-10-CM

## 2021-10-17 ENCOUNTER — Ambulatory Visit
Admission: RE | Admit: 2021-10-17 | Discharge: 2021-10-17 | Disposition: A | Discharge status: Home / Self Care | Payer: Medicare HMO | Source: Ambulatory Visit | Attending: Family | Admitting: Family

## 2021-10-17 ENCOUNTER — Encounter: Payer: Self-pay | Admitting: Family

## 2021-10-17 ENCOUNTER — Other Ambulatory Visit: Payer: Self-pay | Admitting: Family

## 2021-10-17 ENCOUNTER — Other Ambulatory Visit: Payer: Self-pay

## 2021-10-17 DIAGNOSIS — R17 Unspecified jaundice: Secondary | ICD-10-CM | POA: Insufficient documentation

## 2021-10-17 DIAGNOSIS — K824 Cholesterolosis of gallbladder: Secondary | ICD-10-CM | POA: Insufficient documentation

## 2021-10-28 ENCOUNTER — Other Ambulatory Visit: Payer: Medicare HMO

## 2021-11-02 ENCOUNTER — Other Ambulatory Visit: Payer: Self-pay

## 2021-11-02 ENCOUNTER — Other Ambulatory Visit (INDEPENDENT_AMBULATORY_CARE_PROVIDER_SITE_OTHER): Payer: Medicare HMO

## 2021-11-02 DIAGNOSIS — R17 Unspecified jaundice: Secondary | ICD-10-CM

## 2021-11-02 LAB — HEPATIC FUNCTION PANEL
ALT: 17 U/L (ref 0–35)
AST: 17 U/L (ref 0–37)
Albumin: 4.6 g/dL (ref 3.5–5.2)
Alkaline Phosphatase: 76 U/L (ref 39–117)
Bilirubin, Direct: 0.2 mg/dL (ref 0.0–0.3)
Total Bilirubin: 1.7 mg/dL — ABNORMAL HIGH (ref 0.2–1.2)
Total Protein: 6.8 g/dL (ref 6.0–8.3)

## 2021-11-03 LAB — BILIRUBIN, FRACTIONATED(TOT/DIR/INDIR)
Bilirubin, Direct: 0.3 mg/dL — ABNORMAL HIGH (ref 0.0–0.2)
Indirect Bilirubin: 1.3 mg/dL (calc) — ABNORMAL HIGH (ref 0.2–1.2)
Total Bilirubin: 1.6 mg/dL — ABNORMAL HIGH (ref 0.2–1.2)

## 2021-11-08 ENCOUNTER — Ambulatory Visit: Payer: Self-pay | Admitting: Urology

## 2021-11-10 ENCOUNTER — Encounter: Payer: Self-pay | Admitting: Family

## 2021-12-27 ENCOUNTER — Encounter: Payer: Self-pay | Admitting: Family

## 2021-12-27 NOTE — Telephone Encounter (Signed)
LMTCB

## 2021-12-27 NOTE — Telephone Encounter (Signed)
Pt returning call regarding previous message

## 2021-12-28 ENCOUNTER — Other Ambulatory Visit: Payer: Self-pay

## 2021-12-28 ENCOUNTER — Ambulatory Visit (INDEPENDENT_AMBULATORY_CARE_PROVIDER_SITE_OTHER): Payer: Medicare HMO | Admitting: Family

## 2021-12-28 ENCOUNTER — Encounter: Payer: Self-pay | Admitting: Family

## 2021-12-28 VITALS — BP 128/78 | HR 72 | Temp 98.8°F | Ht 63.0 in | Wt 131.8 lb

## 2021-12-28 DIAGNOSIS — K219 Gastro-esophageal reflux disease without esophagitis: Secondary | ICD-10-CM | POA: Diagnosis not present

## 2021-12-28 DIAGNOSIS — R079 Chest pain, unspecified: Secondary | ICD-10-CM

## 2021-12-28 DIAGNOSIS — R0789 Other chest pain: Secondary | ICD-10-CM

## 2021-12-28 MED ORDER — PANTOPRAZOLE SODIUM 20 MG PO TBEC: 20.0000 mg | DELAYED_RELEASE_TABLET | Freq: Two times a day (BID) | ORAL | 1 refills | Status: DC

## 2021-12-28 NOTE — Patient Instructions (Addendum)
Stop lansoprazole  Start protonix 20mg  before breakfast and dinner  Please let me know how you are doing  If you exertional chest pain or pressure, numbness or tingling radiating to left arm or jaw, palpitations, dizziness, frequent headaches, changes in vision, or shortness of breath, please do not delay and call 911 or go to nearest emergency room. I want you to be safe.   Referral to Dr Fletcher Anon. Please discuss stress test, CT calcium score, and echocardiogram.   Let us know if you dont hear back within a week in regards to an appointment being scheduled this month.    Long term use beyond 3 months of proton pump inhibitors ( PPI's) include Nexium, Prilosec, Protonix, Dexilant, and Prevacid.  Some medical studies have identified an association between the long-term use of PPIs and the development of numerous adverse conditions including intestinal infections, pneumonia, stomach cancer, osteoporosis-related bone fractures, chronic kidney disease, deficiencies of certain vitamins and minerals, heart attacks, strokes, dementia, and early death. Those studies have flaws, are not considered definitive, and do not establish a cause-and-effect relationship between PPIs and the adverse conditions. High-quality studies have found that PPIs do not significantly increase the risk of any of these conditions except intestinal infections. Nevertheless, we cannot exclude the possibility that PPIs might confer a small increase in the risk of developing these adverse conditions. For the treatment of GERD, gastroenterologists generally agree that the well-established benefits of PPIs far outweigh their theoretical risks.  Nonetheless I recommend trial wean off of PPI's  if uncomplicated acid reflux and use of histamine 2 blocker ( Pepcid AC).   Patients with history of esophagitis  or Barrett's esophagus should remain on PPI.   I generally recommend trying to control acid reflux with lifestyle modifications  including avoiding trigger foods, not eating 2 hours prior to bedtime. You may use histamine 2 blockers daily to twice daily ( this is Pepcid) and then when symptoms flare, start back on PPI for short course.   Of note, we will need to do an endoscopy ( upper GI) to evaluate your esophagus, stomach in the future if acid reflux persists are you develop red flag symptoms: trouble swallowing, hoarseness, chronic cough, unexplained weight loss.

## 2021-12-28 NOTE — Assessment & Plan Note (Addendum)
Chest pain has resolved. CP was not associated with exertion. Right sided ear pain had resolved. EKG shows sinus brady with t wave inversion aVR, V1, aVL.  When compared to prior EKD 10/16/13 No significant changes or new t wave inversions. Counseled patient on her risk for ASCVD and cardiac event based on family history and advised to remain extremely vigilant and if pain were to recur, to be worse or to be associated with left arm numbness, radiating jaw pain, shortness of breath, palpitations or dizziness, that she needs to report to emergency room or call 911 right away.  Meanwhile, I placed a referral to cardiology for further evaluation, h/o MVP,  risk stratification.  Patient verbalized understanding.

## 2021-12-28 NOTE — Progress Notes (Signed)
Subjective:    Patient ID: Rachael Graham, female    DOB: [birth year], 71 y.o.   MRN: [id number]  CC: Rachael Graham is a 71 y.o. female who presents today for an acute visit.    HPI: Acute visit  Complains of intermittent chest pain and pain in jaw and ears x 7 days, resolved today. Ear pain had been on her right side.   Symptoms are not associated or exacerbated by exercise. Associated with burping. She reports h/o GERD and has seen Dr Allen Norris in the past. She takes lansoprazole qpm ( around 4-6 pm) which helps to alleviate symptom. Tums give partial relief.  She hs noted symptoms are similar to prior symptoms last year in which responded to lansoprazole. The difference and reason for her concern as she noted symptoms during the past several days occurring in the day time whereas had been at night in the past.  She has joined [employer].  She felt physical active and always moving with yard work, house work without CP.  No dizziness, sob, left arm numbness, fever, cough, teeth sensitivity, rashes, shoulder or neck pain.  No food triggers or symptom worse with eating.  She is not having symptoms at night as well controlled she suspects on lansoprazole.  No cp today.   H/o MVP and had echo many years ago.    Consult with Dr Allen Norris 01/2021 whom advised to take lansoprazole qpm for better control.  Colonoscopy UTD 01/2021, repeat in 7 years She reports EGD years ago.  She states no known history of Barrett's esophagus  Mother had family history of heart disease as well as father. Former smoker , quit 1978   HISTORY:  Past Medical History:  Diagnosis Date   Arthritis    knee, s/p steroid injection Dr. Alvan Dame   Chicken pox    Colon polyp    GERD (gastroesophageal reflux disease)    PONV (postoperative nausea and vomiting)    after knee surgery   Trigger finger    Followed at [facility], Dr. Veronia Beets   Vertigo 09/2017   1 episode   Past Surgical History:  Procedure Laterality Date    AUGMENTATION MAMMAPLASTY Bilateral 1988   BREAST BIOPSY     bilateral, both benign   COLONOSCOPY WITH PROPOFOL N/A 02/21/2021   Procedure: COLONOSCOPY WITH BIOPSY;  Surgeon: Lucilla Lame, MD;  Location: [place];  Service: Endoscopy;  Laterality: N/A;   KNEE ARTHROSCOPY     POLYPECTOMY N/A 02/21/2021   Procedure: POLYPECTOMY;  Surgeon: Lucilla Lame, MD;  Location: [place];  Service: Endoscopy;  Laterality: N/A;   TONSILLECTOMY AND ADENOIDECTOMY  1964   TRIGGER FINGER RELEASE     Dr. Veronia Beets   TUBAL LIGATION     VAGINAL DELIVERY     Family History  Problem Relation Age of Onset   Arthritis Mother    Hyperlipidemia Mother    Heart disease Mother    Hypertension Mother    Stroke Mother    Arthritis Father    Hyperlipidemia Father    Heart disease Father    Hypertension Father    Parkinsonism Father    Crohn's disease Sister    Obesity Sister    Arthritis Maternal Grandmother    Cancer Maternal Grandmother        colon   Arthritis Maternal Grandfather    Cancer Maternal Grandfather        colon   Breast cancer Neg Hx     Allergies: Patient has  no known allergies. Current Outpatient Medications on File Prior to Visit  Medication Sig Dispense Refill   calcium carbonate (TUMS - DOSED IN MG ELEMENTAL CALCIUM) 500 MG chewable tablet Chew 1 tablet by mouth as needed for indigestion or heartburn.     ibuprofen (ADVIL) 100 MG tablet Take 100 mg by mouth as needed for fever. Take three tablets daily as needed.     LORazepam (ATIVAN) 0.5 MG tablet Take 1 tablet (0.5 mg total) by mouth 2 (two) times daily as needed for anxiety. 30 tablet 1   No current facility-administered medications on file prior to visit.    Social History   Tobacco Use   Smoking status: Former    Types: Cigarettes    Quit date: 1978    Years since quitting: 45.0   Smokeless tobacco: Never  Vaping Use   Vaping Use: Never used  Substance Use Topics   Alcohol use: Yes     Alcohol/week: 6.0 standard drinks    Types: 6 Cans of beer per week    Comment: occaisionaly   Drug use: No    Review of Systems  Constitutional:  Negative for chills and fever.  HENT:  Negative for congestion, ear pain (resolved) and sinus pressure.   Respiratory:  Negative for cough and shortness of breath.   Cardiovascular:  Negative for chest pain and palpitations.  Gastrointestinal:  Negative for nausea and vomiting.     Objective:    BP 128/78 (BP Location: Left Arm, Patient Position: Sitting, Cuff Size: Normal)    Pulse 72    Temp 98.8 F (37.1 C) (Oral)    Ht 5\' 3"  (1.6 m)    Wt 131 lb 12.8 oz (59.8 kg)    SpO2 96%    BMI 23.35 kg/m    Physical Exam Vitals reviewed.  Constitutional:      Appearance: She is well-developed.  HENT:     Head: Normocephalic and atraumatic.     Right Ear: Hearing, tympanic membrane, ear canal and external ear normal. No decreased hearing noted. No drainage, swelling or tenderness. No middle ear effusion. No foreign body. Tympanic membrane is not erythematous or bulging.     Left Ear: Hearing, tympanic membrane, ear canal and external ear normal. No decreased hearing noted. No drainage, swelling or tenderness.  No middle ear effusion. No foreign body. Tympanic membrane is not erythematous or bulging.     Nose: Nose normal. No rhinorrhea.     Right Sinus: No maxillary sinus tenderness or frontal sinus tenderness.     Left Sinus: No maxillary sinus tenderness or frontal sinus tenderness.     Mouth/Throat:     Pharynx: Uvula midline. No oropharyngeal exudate or posterior oropharyngeal erythema.     Tonsils: No tonsillar abscesses.     Comments: Good dentition Eyes:     Conjunctiva/sclera: Conjunctivae normal.  Cardiovascular:     Rate and Rhythm: Regular rhythm.     Pulses: Normal pulses.     Heart sounds: Normal heart sounds.  Pulmonary:     Effort: Pulmonary effort is normal.     Breath sounds: Normal breath sounds. No wheezing, rhonchi or  rales.  Chest:     Chest wall: No tenderness.     Comments: No reproducible chest wall pain with palpation, pressure. No pain with rotating shoulder or neck. Lymphadenopathy:     Head:     Right side of head: No submental, submandibular, tonsillar, preauricular, posterior auricular or occipital adenopathy.  Left side of head: No submental, submandibular, tonsillar, preauricular, posterior auricular or occipital adenopathy.     Cervical: No cervical adenopathy.  Skin:    General: Skin is warm and dry.  Neurological:     Mental Status: She is alert.  Psychiatric:        Speech: Speech normal.        Behavior: Behavior normal.        Thought Content: Thought content normal.       Assessment & Plan:   Problem List Items Addressed This Visit       Digestive   Gastroesophageal reflux disease    Presentation is consistent with GERD. Stop lansoprazole. Trial protonix 20 mg AC. Counseled on long risks of PPI and at some point, we will try to wean off protonix and start pepcid ac. We will discuss further at follow up.       Relevant Medications   pantoprazole (PROTONIX) 20 MG tablet     Other   Chest pain    Chest pain has resolved. CP was not associated with exertion. Right sided ear pain had resolved. EKG shows sinus brady with t wave inversion aVR, V1, aVL.  When compared to prior EKD 10/16/13 No significant changes or new t wave inversions. Counseled patient on her risk for ASCVD and cardiac event based on family history and advised to remain extremely vigilant and if pain were to recur, to be worse or to be associated with left arm numbness, radiating jaw pain, shortness of breath, palpitations or dizziness, that she needs to report to emergency room or call 911 right away.  Meanwhile, I placed a referral to cardiology for further evaluation, h/o MVP,  risk stratification.  Patient verbalized understanding.      Other Visit Diagnoses     Chest heaviness    -  Primary    Relevant Medications   pantoprazole (PROTONIX) 20 MG tablet   Other Relevant Orders   EKG 12-Lead (Completed)   Ambulatory referral to Cardiology          I have discontinued Izora Gala Palma's omeprazole. I am also having her start on pantoprazole. Additionally, I am having her maintain her calcium carbonate, ibuprofen, and LORazepam.   Meds ordered this encounter  Medications   pantoprazole (PROTONIX) 20 MG tablet    Sig: Take 1 tablet (20 mg total) by mouth 2 (two) times daily before a meal.    Dispense:  120 tablet    Refill:  1    Order Specific Question:   Supervising Provider    Answer:   Crecencio Mc [[id number]]    Return precautions given.   Risks, benefits, and alternatives of the medications and treatment plan prescribed today were discussed, and patient expressed understanding.   Education regarding symptom management and diagnosis given to patient on AVS.  Continue to follow with Burnard Hawthorne, FNP for routine health maintenance.   Rachael Graham and I agreed with plan.   Mable Paris, FNP

## 2021-12-28 NOTE — Assessment & Plan Note (Signed)
Presentation is consistent with GERD. Stop lansoprazole. Trial protonix 20 mg AC. Counseled on long risks of PPI and at some point, we will try to wean off protonix and start pepcid ac. We will discuss further at follow up.

## 2022-01-03 ENCOUNTER — Telehealth: Payer: Self-pay | Admitting: Family

## 2022-01-03 ENCOUNTER — Encounter: Payer: Self-pay | Admitting: Family

## 2022-01-03 NOTE — Telephone Encounter (Signed)
How soon can pt see cardiology? Referral placed to dr Fletcher Anon however if earlier with colleague, we can speak to her if she is okay with seeing one of his colleagues

## 2022-01-05 ENCOUNTER — Ambulatory Visit: Payer: Medicare HMO | Admitting: Cardiology

## 2022-01-05 ENCOUNTER — Other Ambulatory Visit: Payer: Self-pay

## 2022-01-05 ENCOUNTER — Encounter: Payer: Self-pay | Admitting: Cardiology

## 2022-01-05 VITALS — BP 146/80 | HR 64 | Ht 63.0 in | Wt 131.2 lb

## 2022-01-05 DIAGNOSIS — R072 Precordial pain: Secondary | ICD-10-CM

## 2022-01-05 DIAGNOSIS — E7800 Pure hypercholesterolemia, unspecified: Secondary | ICD-10-CM | POA: Diagnosis not present

## 2022-01-05 DIAGNOSIS — R030 Elevated blood-pressure reading, without diagnosis of hypertension: Secondary | ICD-10-CM

## 2022-01-05 MED ORDER — METOPROLOL TARTRATE 100 MG PO TABS: 100.0000 mg | ORAL_TABLET | Freq: Once | ORAL | 0 refills | Status: DC

## 2022-01-05 NOTE — Progress Notes (Signed)
Cardiology Office Note:    Date:  01/05/2022   ID:  Rachael Graham, DOB July 13, 1951, MRN [id number]  PCP:  Burnard Hawthorne, FNP   [facility] Providers Cardiologist:  Kate Sable, MD     Referring MD: Burnard Hawthorne, FNP   Chief Complaint  Patient presents with   Chest cheaviness    History of Present Illness:    Rachael Graham is a 71 y.o. female with a hx of hyperlipidemia, GERD, former smoker x10 years who presents due to chest pain.  Patient states having symptoms of chest pressure ongoing for some time now.  She has a history of reflux, previously treated with lansoprazole.  States when she has really bad reflux symptoms, she gets chest pressure radiating to her jaw and ears.  Symptoms of reflux were well controlled until recently when she noticed occasional chest pressure.  Lansoprazole was switched to Protonix by PCP.  Due to risk factors and family history, patient advised to obtain cardiac work-up.  Her mother had one-vessel bypass in his 70s.   Past Medical History:  Diagnosis Date   Arthritis    knee, s/p steroid injection Dr. Alvan Dame   Chicken pox    Colon polyp    GERD (gastroesophageal reflux disease)    PONV (postoperative nausea and vomiting)    after knee surgery   Trigger finger    Followed at [facility], Dr. Veronia Beets   Vertigo 09/2017   1 episode    Past Surgical History:  Procedure Laterality Date   AUGMENTATION MAMMAPLASTY Bilateral 1988   BREAST BIOPSY     bilateral, both benign   COLONOSCOPY WITH PROPOFOL N/A 02/21/2021   Procedure: COLONOSCOPY WITH BIOPSY;  Surgeon: Lucilla Lame, MD;  Location: [place];  Service: Endoscopy;  Laterality: N/A;   KNEE ARTHROSCOPY     POLYPECTOMY N/A 02/21/2021   Procedure: POLYPECTOMY;  Surgeon: Lucilla Lame, MD;  Location: [place];  Service: Endoscopy;  Laterality: N/A;   TONSILLECTOMY AND ADENOIDECTOMY  1964   TRIGGER FINGER RELEASE     Dr. Veronia Beets   TUBAL LIGATION      VAGINAL DELIVERY      Current Medications: Current Meds  Medication Sig   calcium carbonate (TUMS - DOSED IN MG ELEMENTAL CALCIUM) 500 MG chewable tablet Chew 1 tablet by mouth as needed for indigestion or heartburn.   ibuprofen (ADVIL) 100 MG tablet Take 100 mg by mouth as needed for fever. Take three tablets daily as needed.   LORazepam (ATIVAN) 0.5 MG tablet Take 1 tablet (0.5 mg total) by mouth 2 (two) times daily as needed for anxiety.   metoprolol tartrate (LOPRESSOR) 100 MG tablet Take 1 tablet (100 mg total) by mouth once for 1 dose. Take 2 hours prior to your CT scan.   pantoprazole (PROTONIX) 20 MG tablet Take 1 tablet (20 mg total) by mouth 2 (two) times daily before a meal.     Allergies:   Patient has no known allergies.   Social History   Socioeconomic History   Marital status: Married    Spouse name: Not on file   Number of children: Not on file   Years of education: Not on file   Highest education level: Not on file  Occupational History   Not on file  Tobacco Use   Smoking status: Former    Types: Cigarettes    Quit date: 1978    Years since quitting: 45.0   Smokeless tobacco: Never  Vaping Use  Vaping Use: Never used  Substance and Sexual Activity   Alcohol use: Yes    Alcohol/week: 6.0 standard drinks    Types: 6 Cans of beer per week    Comment: occaisionaly   Drug use: No   Sexual activity: Not on file  Other Topics Concern   Not on file  Social History Narrative   Lives in [place]. From [place]. Two children in [place].      Diet - Regular   Exercise - tennis   Social Determinants of Health   Financial Resource Strain: Not on file  Food Insecurity: Not on file  Transportation Needs: Not on file  Physical Activity: Not on file  Stress: Not on file  Social Connections: Not on file     Family History: The patient's family history includes Arthritis in her father, maternal grandfather, maternal grandmother, and mother; Cancer in her maternal  grandfather and maternal grandmother; Crohn's disease in her sister; Heart disease in her father and mother; Hyperlipidemia in her father and mother; Hypertension in her father and mother; Obesity in her sister; Parkinsonism in her father; Stroke in her mother. There is no history of Breast cancer.  ROS:   Please see the history of present illness.     All other systems reviewed and are negative.  EKGs/Labs/Other Studies Reviewed:    The following studies were reviewed today:   EKG:  EKG is  ordered today.  The ekg ordered today demonstrates normal sinus rhythm, normal ECG  Recent Labs: 09/26/2021: BUN 14; Creatinine, Ser 0.70; Hemoglobin 13.6; Platelets 257.0; Potassium 4.5; Sodium 141; TSH 1.36 11/02/2021: ALT 17  Recent Lipid Panel    Component Value Date/Time   CHOL 208 (H) 09/26/2021 0842   TRIG 82.0 09/26/2021 0842   HDL 67.10 09/26/2021 0842   CHOLHDL 3 09/26/2021 0842   VLDL 16.4 09/26/2021 0842   LDLCALC 124 (H) 09/26/2021 0842   LDLDIRECT 156.2 11/10/2013 0807     Risk Assessment/Calculations:          Physical Exam:    VS:  BP (!) 146/80    Pulse 64    Ht 5\' 3"  (1.6 m)    Wt 131 lb 3.2 oz (59.5 kg)    SpO2 96%    BMI 23.24 kg/m     Wt Readings from Last 3 Encounters:  01/05/22 131 lb 3.2 oz (59.5 kg)  12/28/21 131 lb 12.8 oz (59.8 kg)  09/12/21 130 lb 6.4 oz (59.1 kg)     GEN:  Well nourished, well developed in no acute distress HEENT: Normal NECK: No JVD; No carotid bruits LYMPHATICS: No lymphadenopathy CARDIAC: RRR, no murmurs, rubs, gallops RESPIRATORY:  Clear to auscultation without rales, wheezing or rhonchi  ABDOMEN: Soft, non-tender, non-distended MUSCULOSKELETAL:  No edema; No deformity  SKIN: Warm and dry NEUROLOGIC:  Alert and oriented x 3 PSYCHIATRIC:  Normal affect   ASSESSMENT:    1. Precordial pain   2. Pure hypercholesterolemia   3. Elevated BP without diagnosis of hypertension    PLAN:    In order of problems listed  above:  Chest pain, risk factors former smoker, hyperlipidemia.  Get echo, get coronary CTA to evaluate CAD. Hyperlipidemia, 10-year ASCVD risk 7.4%.  Obtain coronary CTA as above, if CAD or significant calcification noted, plan to start statin. Elevated BP, BP previously always controlled.  Continue to monitor.  No indication to start BP meds at this time.  Follow-up after echo and coronary CTA  Medication Adjustments/Labs and Tests Ordered: Current medicines are reviewed at length with the patient today.  Concerns regarding medicines are outlined above.  Orders Placed This Encounter  Procedures   CT CORONARY MORPH W/CTA COR W/SCORE W/CA W/CM &/OR WO/CM   Basic metabolic panel   EKG 70-JGGE   ECHOCARDIOGRAM COMPLETE   Meds ordered this encounter  Medications   metoprolol tartrate (LOPRESSOR) 100 MG tablet    Sig: Take 1 tablet (100 mg total) by mouth once for 1 dose. Take 2 hours prior to your CT scan.    Dispense:  1 tablet    Refill:  0    Patient Instructions  Medication Instructions:   Your physician recommends that you continue on your current medications as directed. Please refer to the Current Medication list given to you today.  *If you need a refill on your cardiac medications before your next appointment, please call your pharmacy*   Lab Work:  None ordered  If you have labs (blood work) drawn today and your tests are completely normal, you will receive your results only by: [place] (if you have MyChart) OR A paper copy in the mail If you have any lab test that is abnormal or we need to change your treatment, we will call you to review the results.   Testing/Procedures:   Your physician has requested that you have an echocardiogram. Echocardiography is a painless test that uses sound waves to create images of your heart. It provides your doctor with information about the size and shape of your heart and how well your hearts chambers and valves  are working. This procedure takes approximately one hour. There are no restrictions for this procedure.  2.   Your physician has requested that you have cardiac CT. Cardiac computed tomography (CT) is a painless  test that uses an x-ray machine to take clear, detailed pictures of your heart.    Your cardiac CT will be scheduled at:  [facility] [address] [place], [place] [address] [phone]  Thursday 01/19/22  8:45 AM  Please arrive 15 mins early for check-in and test prep.    Please follow these instructions carefully (unless otherwise directed):    On the Night Before the Test: Be sure to Drink plenty of water. Do not consume any caffeinated/decaffeinated beverages or chocolate 12 hours prior to your test.  On the Day of the Test: Drink plenty of water until 1 hour prior to the test. Do not eat any food 4 hours prior to the test. You may take your regular medications prior to the test.  Take metoprolol (Lopressor) 100 MG two hours prior to test. FEMALES- please wear underwire-free bra if available, avoid dresses & tight clothing       After the Test: Drink plenty of water. After receiving IV contrast, you may experience a mild flushed feeling. This is normal. On occasion, you may experience a mild rash up to 24 hours after the test. This is not dangerous. If this occurs, you can take Benadryl 25 mg and increase your fluid intake. If you experience trouble breathing, this can be serious. If it is severe call 911 IMMEDIATELY. If it is mild, please call our office. If you take any of these medications: Glipizide/Metformin, Avandament, Glucavance, please do not take 48 hours after completing test unless otherwise instructed.  Please allow 2-4 weeks for scheduling of routine cardiac CTs. Some insurance companies require a pre-authorization which may delay scheduling of  this test.   For non-scheduling related questions, please  contact the cardiac imaging nurse navigator should you have any questions/concerns: Marchia Bond, Cardiac Imaging Nurse Navigator Gordy Clement, Cardiac Imaging Nurse Navigator [facility] Direct Office Dial: [phone]   For scheduling needs, including cancellations and rescheduling, please call [place], [phone].     Follow-Up: At [facility], you and your health needs are our priority.  As part of our continuing mission to provide you with exceptional heart care, we have created designated Provider Care Teams.  These Care Teams include your primary Cardiologist (physician) and Advanced Practice Providers (APPs -  Physician Assistants and Nurse Practitioners) who all work together to provide you with the care you need, when you need it.  We recommend signing up for the patient portal called "MyChart".  Sign up information is provided on this After Visit Summary.  MyChart is used to connect with patients for Virtual Visits (Telemedicine).  Patients are able to view lab/test results, encounter notes, upcoming appointments, etc.  Non-urgent messages can be sent to your provider as well.   To learn more about what you can do with MyChart, go to [email or web address].    Your next appointment:   Follow up after testing ([facility] 01/19/22)  The format for your next appointment:   In Person g Provider:   Kate Sable, MD    Other Instructions     Signed, Kate Sable, MD  01/05/2022 12:34 PM    [place]

## 2022-01-05 NOTE — Patient Instructions (Addendum)
Medication Instructions:   Your physician recommends that you continue on your current medications as directed. Please refer to the Current Medication list given to you today.  *If you need a refill on your cardiac medications before your next appointment, please call your pharmacy*   Lab Work:  None ordered  If you have labs (blood work) drawn today and your tests are completely normal, you will receive your results only by: [place] (if you have MyChart) OR A paper copy in the mail If you have any lab test that is abnormal or we need to change your treatment, we will call you to review the results.   Testing/Procedures:   Your physician has requested that you have an echocardiogram. Echocardiography is a painless test that uses sound waves to create images of your heart. It provides your doctor with information about the size and shape of your heart and how well your hearts chambers and valves are working. This procedure takes approximately one hour. There are no restrictions for this procedure.  2.   Your physician has requested that you have cardiac CT. Cardiac computed tomography (CT) is a painless  test that uses an x-ray machine to take clear, detailed pictures of your heart.    Your cardiac CT will be scheduled at:  [facility] [address] [place], [place] [address] [phone]  Thursday 01/19/22  8:45 AM  Please arrive 15 mins early for check-in and test prep.    Please follow these instructions carefully (unless otherwise directed):    On the Night Before the Test: Be sure to Drink plenty of water. Do not consume any caffeinated/decaffeinated beverages or chocolate 12 hours prior to your test.  On the Day of the Test: Drink plenty of water until 1 hour prior to the test. Do not eat any food 4 hours prior to the test. You may take your regular medications prior to the test.  Take metoprolol (Lopressor)  100 MG two hours prior to test. FEMALES- please wear underwire-free bra if available, avoid dresses & tight clothing       After the Test: Drink plenty of water. After receiving IV contrast, you may experience a mild flushed feeling. This is normal. On occasion, you may experience a mild rash up to 24 hours after the test. This is not dangerous. If this occurs, you can take Benadryl 25 mg and increase your fluid intake. If you experience trouble breathing, this can be serious. If it is severe call 911 IMMEDIATELY. If it is mild, please call our office. If you take any of these medications: Glipizide/Metformin, Avandament, Glucavance, please do not take 48 hours after completing test unless otherwise instructed.  Please allow 2-4 weeks for scheduling of routine cardiac CTs. Some insurance companies require a pre-authorization which may delay scheduling of this test.   For non-scheduling related questions, please contact the cardiac imaging nurse navigator should you have any questions/concerns: Marchia Bond, Cardiac Imaging Nurse Navigator Gordy Clement, Cardiac Imaging Nurse Navigator [facility] Direct Office Dial: [phone]   For scheduling needs, including cancellations and rescheduling, please call [place], [phone].     Follow-Up: At [facility], you and your health needs are our priority.  As part of our continuing mission to provide you with exceptional heart care, we have created designated Provider Care Teams.  These Care Teams include your primary Cardiologist (physician) and Advanced Practice Providers (APPs -  Physician Assistants and Nurse Practitioners)  who all work together to provide you with the care you need, when you need it.  We recommend signing up for the patient portal called "MyChart".  Sign up information is provided on this After Visit Summary.  MyChart is used to connect with patients for Virtual Visits (Telemedicine).   Patients are able to view lab/test results, encounter notes, upcoming appointments, etc.  Non-urgent messages can be sent to your provider as well.   To learn more about what you can do with MyChart, go to [email or web address].    Your next appointment:   Follow up after testing ([facility] 01/19/22)  The format for your next appointment:   In Person g Provider:   Kate Sable, MD    Other Instructions

## 2022-01-06 LAB — BASIC METABOLIC PANEL
BUN/Creatinine Ratio: 21 (ref 12–28)
BUN: 12 mg/dL (ref 8–27)
CO2: 26 mmol/L (ref 20–29)
Calcium: 9.4 mg/dL (ref 8.7–10.3)
Chloride: 103 mmol/L (ref 96–106)
Creatinine, Ser: 0.58 mg/dL (ref 0.57–1.00)
Glucose: 95 mg/dL (ref 70–99)
Potassium: 5 mmol/L (ref 3.5–5.2)
Sodium: 141 mmol/L (ref 134–144)
eGFR: 99 mL/min/{1.73_m2} (ref >59)

## 2022-01-17 ENCOUNTER — Telehealth (HOSPITAL_COMMUNITY): Payer: Self-pay | Admitting: Emergency Medicine

## 2022-01-17 NOTE — Telephone Encounter (Signed)
Reaching out to patient to offer assistance regarding upcoming cardiac imaging study; pt verbalizes understanding of appt date/time, parking situation and where to check in, pre-test NPO status and medications ordered, and verified current allergies; name and call back number provided for further questions should they arise Rachael Newsham RN Navigator Cardiac Imaging [facility] [phone] office [phone] cell 

## 2022-01-19 ENCOUNTER — Other Ambulatory Visit: Payer: Self-pay

## 2022-01-19 ENCOUNTER — Ambulatory Visit
Admission: RE | Admit: 2022-01-19 | Discharge: 2022-01-19 | Disposition: A | Discharge status: Home / Self Care | Payer: Medicare HMO | Source: Ambulatory Visit | Attending: Cardiology | Admitting: Cardiology

## 2022-01-19 DIAGNOSIS — R072 Precordial pain: Secondary | ICD-10-CM

## 2022-01-19 MED ORDER — NITROGLYCERIN 0.4 MG SL SUBL
0.8000 mg | SUBLINGUAL_TABLET | Freq: Once | SUBLINGUAL | Status: AC
  Administered 2022-01-19: 0.8 mg via SUBLINGUAL

## 2022-01-19 MED ORDER — IOHEXOL 350 MG/ML SOLN
80.0000 mL | Freq: Once | INTRAVENOUS | Status: AC | PRN
  Administered 2022-01-19: 80 mL via INTRAVENOUS

## 2022-01-19 NOTE — Progress Notes (Signed)
Patient tolerated CT well. Drank water after. Vital signs stable encourage to drink water throughout day.Reasons explained and verbalized understanding. Ambulated steady gait.  

## 2022-01-25 ENCOUNTER — Telehealth: Payer: Self-pay

## 2022-01-25 MED ORDER — ASPIRIN EC 81 MG PO TBEC: 81.0000 mg | DELAYED_RELEASE_TABLET | Freq: Every day | ORAL | 3 refills | Status: AC

## 2022-01-25 MED ORDER — ATORVASTATIN CALCIUM 40 MG PO TABS: 40.0000 mg | ORAL_TABLET | Freq: Every day | ORAL | 3 refills | Status: DC

## 2022-01-25 NOTE — Telephone Encounter (Signed)
Called patient and left a detailed VM per DPR on file.Encouraged patient to call back with any questions or concerns. 

## 2022-01-25 NOTE — Telephone Encounter (Signed)
-----   Message from Kate Sable, MD sent at 01/23/2022  8:40 AM EST ----- Coronary calcification, no significant obstruction, mild nonobstructive CAD.  No findings to suggest etiology of chest pain.  Start aspirin 81 mg daily, start Lipitor 40 mg daily.

## 2022-01-30 ENCOUNTER — Ambulatory Visit (INDEPENDENT_AMBULATORY_CARE_PROVIDER_SITE_OTHER): Payer: Medicare HMO

## 2022-01-30 ENCOUNTER — Other Ambulatory Visit: Payer: Self-pay

## 2022-01-30 DIAGNOSIS — R072 Precordial pain: Secondary | ICD-10-CM

## 2022-01-30 LAB — ECHOCARDIOGRAM COMPLETE
AR max vel: 2.11 cm2
AV Area VTI: 2 cm2
AV Area mean vel: 2.06 cm2
AV Mean grad: 4 mmHg
AV Peak grad: 6.9 mmHg
Ao pk vel: 1.31 m/s
Area-P 1/2: 2.82 cm2
Calc EF: 69 %
S' Lateral: 2.9 cm
Single Plane A2C EF: 72.7 %
Single Plane A4C EF: 66.1 %

## 2022-02-06 ENCOUNTER — Other Ambulatory Visit: Payer: Self-pay

## 2022-02-06 ENCOUNTER — Encounter: Payer: Self-pay | Admitting: Cardiology

## 2022-02-06 ENCOUNTER — Ambulatory Visit: Payer: Medicare HMO | Admitting: Cardiology

## 2022-02-06 VITALS — BP 136/70 | HR 84 | Ht 63.0 in | Wt 133.0 lb

## 2022-02-06 DIAGNOSIS — I2510 Atherosclerotic heart disease of native coronary artery without angina pectoris: Secondary | ICD-10-CM

## 2022-02-06 DIAGNOSIS — E7800 Pure hypercholesterolemia, unspecified: Secondary | ICD-10-CM

## 2022-02-06 DIAGNOSIS — K2100 Gastro-esophageal reflux disease with esophagitis, without bleeding: Secondary | ICD-10-CM

## 2022-02-06 NOTE — Patient Instructions (Signed)
Medication Instructions:  Your physician recommends that you continue on your current medications as directed. Please refer to the Current Medication list given to you today.  *If you need a refill on your cardiac medications before your next appointment, please call your pharmacy*   Lab Work:  Your physician recommends that you return for a FASTING lipid profile: The week prior to your 3 month follow up.  - You will need to be fasting. Please do not have anything to eat or drink after midnight the morning you have the lab work. You may only have water or black coffee with no cream or sugar.   We will call you closer to your appointment time and schedule you in our office for this lab draw.    Testing/Procedures: None ordered   Follow-Up: At [facility], you and your health needs are our priority.  As part of our continuing mission to provide you with exceptional heart care, we have created designated Provider Care Teams.  These Care Teams include your primary Cardiologist (physician) and Advanced Practice Providers (APPs -  Physician Assistants and Nurse Practitioners) who all work together to provide you with the care you need, when you need it.  We recommend signing up for the patient portal called "MyChart".  Sign up information is provided on this After Visit Summary.  MyChart is used to connect with patients for Virtual Visits (Telemedicine).  Patients are able to view lab/test results, encounter notes, upcoming appointments, etc.  Non-urgent messages can be sent to your provider as well.   To learn more about what you can do with MyChart, go to [email or web address].    Your next appointment:   3 month(s)  The format for your next appointment:   In Person  Provider:   You may see Kate Sable, MD or one of the following Advanced Practice Providers on your designated Care Team:   Murray Hodgkins, NP Christell Faith, PA-C Cadence Kathlen Mody, [place]    Other  Instructions

## 2022-02-06 NOTE — Progress Notes (Signed)
Cardiology Office Note:    Date:  02/06/2022   ID:  [name], DOB 1951-09-30, MRN [id number]  PCP:  Burnard Hawthorne, FNP   [facility] Providers Cardiologist:  Kate Sable, MD     Referring MD: Burnard Hawthorne, FNP   Chief Complaint  Patient presents with   Other    Follow up post ECHO and CT -- Meds reviewed verbally with patient.     History of Present Illness:    Rachael Graham is a 71 y.o. female with a hx of hyperlipidemia, GERD, former smoker x10 years who presents for follow-up.  Patient was last seen due to chest pain.  Echocardiogram and coronary CTA was obtained to evaluate cardiac function and presence of CAD.  She increase dose of Protonix to twice daily with improvement in her symptoms of chest pain.  States doing okay, presents for testing results.    Prior notes Echo 01/2022 EF 60 to 65% Coronary CTA 12/2021 calcium score 603, mild stenosis in proximal LAD and RCA.  Past Medical History:  Diagnosis Date   Arthritis    knee, s/p steroid injection Dr. Alvan Dame   Chicken pox    Colon polyp    GERD (gastroesophageal reflux disease)    PONV (postoperative nausea and vomiting)    after knee surgery   Trigger finger    Followed at [facility], Dr. Veronia Beets   Vertigo 09/2017   1 episode    Past Surgical History:  Procedure Laterality Date   AUGMENTATION MAMMAPLASTY Bilateral 1988   BREAST BIOPSY     bilateral, both benign   COLONOSCOPY WITH PROPOFOL N/A 02/21/2021   Procedure: COLONOSCOPY WITH BIOPSY;  Surgeon: Lucilla Lame, MD;  Location: [facility];  Service: Endoscopy;  Laterality: N/A;   KNEE ARTHROSCOPY     POLYPECTOMY N/A 02/21/2021   Procedure: POLYPECTOMY;  Surgeon: Lucilla Lame, MD;  Location: [facility];  Service: Endoscopy;  Laterality: N/A;   TONSILLECTOMY AND ADENOIDECTOMY  1964   TRIGGER FINGER RELEASE     Dr. Veronia Beets   TUBAL LIGATION     VAGINAL DELIVERY      Current Medications: Current Meds   Medication Sig   aspirin EC 81 MG tablet Take 1 tablet (81 mg total) by mouth daily. Swallow whole.   atorvastatin (LIPITOR) 40 MG tablet Take 1 tablet (40 mg total) by mouth daily.   calcium carbonate (TUMS - DOSED IN MG ELEMENTAL CALCIUM) 500 MG chewable tablet Chew 1 tablet by mouth as needed for indigestion or heartburn.   ibuprofen (ADVIL) 100 MG tablet Take 100 mg by mouth as needed for fever. Take three tablets daily as needed.   LORazepam (ATIVAN) 0.5 MG tablet Take 1 tablet (0.5 mg total) by mouth 2 (two) times daily as needed for anxiety.   pantoprazole (PROTONIX) 20 MG tablet Take 1 tablet (20 mg total) by mouth 2 (two) times daily before a meal.     Allergies:   Patient has no known allergies.   Social History   Socioeconomic History   Marital status: Married    Spouse name: Not on file   Number of children: Not on file   Years of education: Not on file   Highest education level: Not on file  Occupational History   Not on file  Tobacco Use   Smoking status: Former    Types: Cigarettes    Quit date: 1978    Years since quitting: 45.1   Smokeless tobacco: Never  Vaping  Use   Vaping Use: Never used  Substance and Sexual Activity   Alcohol use: Yes    Alcohol/week: 6.0 standard drinks    Types: 6 Cans of beer per week    Comment: occaisionaly   Drug use: No   Sexual activity: Not on file  Other Topics Concern   Not on file  Social History Narrative   Lives in [place]. From [place]. Two children in [place].      Diet - Regular   Exercise - tennis   Social Determinants of Health   Financial Resource Strain: Not on file  Food Insecurity: Not on file  Transportation Needs: Not on file  Physical Activity: Not on file  Stress: Not on file  Social Connections: Not on file     Family History: The patient's family history includes Arthritis in her father, maternal grandfather, maternal grandmother, and mother; Cancer in her maternal grandfather and maternal  grandmother; Crohn's disease in her sister; Heart disease in her father and mother; Hyperlipidemia in her father and mother; Hypertension in her father and mother; Obesity in her sister; Parkinsonism in her father; Stroke in her mother. There is no history of Breast cancer.  ROS:   Please see the history of present illness.     All other systems reviewed and are negative.  EKGs/Labs/Other Studies Reviewed:    The following studies were reviewed today:   EKG:  EKG is  ordered today.  The ekg ordered today demonstrates normal sinus rhythm, normal ECG  Recent Labs: 09/26/2021: Hemoglobin 13.6; Platelets 257.0; TSH 1.36 11/02/2021: ALT 17 01/05/2022: BUN 12; Creatinine, Ser 0.58; Potassium 5.0; Sodium 141  Recent Lipid Panel    Component Value Date/Time   CHOL 208 (H) 09/26/2021 0842   TRIG 82.0 09/26/2021 0842   HDL 67.10 09/26/2021 0842   CHOLHDL 3 09/26/2021 0842   VLDL 16.4 09/26/2021 0842   LDLCALC 124 (H) 09/26/2021 0842   LDLDIRECT 156.2 11/10/2013 0807     Risk Assessment/Calculations:          Physical Exam:    VS:  BP 136/70 (BP Location: Left Arm, Patient Position: Sitting, Cuff Size: Normal)    Pulse 84    Ht 5\' 3"  (1.6 m)    Wt 133 lb (60.3 kg)    SpO2 98%    BMI 23.56 kg/m     Wt Readings from Last 3 Encounters:  02/06/22 133 lb (60.3 kg)  01/05/22 131 lb 3.2 oz (59.5 kg)  12/28/21 131 lb 12.8 oz (59.8 kg)     GEN:  Well nourished, well developed in no acute distress HEENT: Normal NECK: No JVD; No carotid bruits LYMPHATICS: No lymphadenopathy CARDIAC: RRR, no murmurs, rubs, gallops RESPIRATORY:  Clear to auscultation without rales, wheezing or rhonchi  ABDOMEN: Soft, non-tender, non-distended MUSCULOSKELETAL:  No edema; No deformity  SKIN: Warm and dry NEUROLOGIC:  Alert and oriented x 3 PSYCHIATRIC:  Normal affect   ASSESSMENT:    1. Coronary artery disease involving native coronary artery of native heart without angina pectoris   2. Pure  hypercholesterolemia   3. Gastroesophageal reflux disease with esophagitis without hemorrhage     PLAN:    In order of problems listed above:  CAD, denies chest pain currently,.  Echo showed normal systolic and diastolic function, EF 60 to 65%.  Coronary CTA with calcium score 693, mild nonobstructive proximal LAD 90% RCA disease.  Start aspirin 81 mg, Lipitor 40 mg daily. Hyperlipidemia, LDL not at goal, start Lipitor  40 mg daily, repeat lipid panel in 3 months. GERD, symptoms of chest pain improved with taking Protonix twice daily.  Continue.  Follow-up in 3 months.      Medication Adjustments/Labs and Tests Ordered: Current medicines are reviewed at length with the patient today.  Concerns regarding medicines are outlined above.  Orders Placed This Encounter  Procedures   Lipid panel   No orders of the defined types were placed in this encounter.   Patient Instructions  Medication Instructions:  Your physician recommends that you continue on your current medications as directed. Please refer to the Current Medication list given to you today.  *If you need a refill on your cardiac medications before your next appointment, please call your pharmacy*   Lab Work:  Your physician recommends that you return for a FASTING lipid profile: The week prior to your 3 month follow up.  - You will need to be fasting. Please do not have anything to eat or drink after midnight the morning you have the lab work. You may only have water or black coffee with no cream or sugar.   We will call you closer to your appointment time and schedule you in our office for this lab draw.    Testing/Procedures: None ordered   Follow-Up: At [facility], you and your health needs are our priority.  As part of our continuing mission to provide you with exceptional heart care, we have created designated Provider Care Teams.  These Care Teams include your primary Cardiologist (physician) and Advanced  Practice Providers (APPs -  Physician Assistants and Nurse Practitioners) who all work together to provide you with the care you need, when you need it.  We recommend signing up for the patient portal called "MyChart".  Sign up information is provided on this After Visit Summary.  MyChart is used to connect with patients for Virtual Visits (Telemedicine).  Patients are able to view lab/test results, encounter notes, upcoming appointments, etc.  Non-urgent messages can be sent to your provider as well.   To learn more about what you can do with MyChart, go to [email or web address].    Your next appointment:   3 month(s)  The format for your next appointment:   In Person  Provider:   You may see Kate Sable, MD or one of the following Advanced Practice Providers on your designated Care Team:   Murray Hodgkins, NP Christell Faith, PA-C Cadence Kathlen Mody, [place]    Other Instructions     Signed, Kate Sable, MD  02/06/2022 4:26 PM    [place]

## 2022-04-20 ENCOUNTER — Other Ambulatory Visit: Payer: Self-pay | Admitting: Family

## 2022-04-20 DIAGNOSIS — R0789 Other chest pain: Secondary | ICD-10-CM

## 2022-05-04 ENCOUNTER — Telehealth: Payer: Self-pay | Admitting: Cardiology

## 2022-05-04 DIAGNOSIS — E7800 Pure hypercholesterolemia, unspecified: Secondary | ICD-10-CM

## 2022-05-04 NOTE — Telephone Encounter (Signed)
[facility] before [facility] visit.  Attempted to remind patient lmov.  ? ?Please change order ?

## 2022-05-04 NOTE — Telephone Encounter (Signed)
Done

## 2022-05-08 ENCOUNTER — Ambulatory Visit: Payer: Medicare HMO | Admitting: Cardiology

## 2022-05-24 NOTE — Telephone Encounter (Signed)
Spoke with patient she will go prior to July fu

## 2022-05-24 NOTE — Telephone Encounter (Signed)
Labs still needed

## 2022-06-21 ENCOUNTER — Other Ambulatory Visit
Admission: RE | Admit: 2022-06-21 | Discharge: 2022-06-21 | Disposition: A | Discharge status: Home / Self Care | Payer: Medicare HMO | Attending: Cardiology | Admitting: Cardiology

## 2022-06-21 DIAGNOSIS — E7800 Pure hypercholesterolemia, unspecified: Secondary | ICD-10-CM | POA: Diagnosis not present

## 2022-06-21 LAB — LIPID PANEL
Cholesterol: 150 mg/dL (ref 0–200)
HDL: 81 mg/dL (ref >40)
LDL Cholesterol: 61 mg/dL (ref 0–99)
Total CHOL/HDL Ratio: 1.9 RATIO
Triglycerides: 39 mg/dL (ref <150)
VLDL: 8 mg/dL (ref 0–40)

## 2022-06-26 ENCOUNTER — Encounter: Payer: Self-pay | Admitting: Cardiology

## 2022-06-26 ENCOUNTER — Ambulatory Visit: Payer: Medicare HMO | Admitting: Cardiology

## 2022-06-26 VITALS — BP 122/70 | HR 67 | Ht 63.0 in | Wt 130.2 lb

## 2022-06-26 DIAGNOSIS — I2510 Atherosclerotic heart disease of native coronary artery without angina pectoris: Secondary | ICD-10-CM | POA: Diagnosis not present

## 2022-06-26 DIAGNOSIS — E7800 Pure hypercholesterolemia, unspecified: Secondary | ICD-10-CM

## 2022-06-26 NOTE — Progress Notes (Signed)
Cardiology Office Note:    Date:  06/26/2022   ID:  [name], DOB [birth year], MRN [id number]  PCP:  Burnard Hawthorne, FNP   [facility] Providers Cardiologist:  Kate Sable, MD     Referring MD: Burnard Hawthorne, FNP   Chief Complaint  Patient presents with   Follow-up    3 month follow up. Patient states that she is doing well. Meds reviewed with patient.     History of Present Illness:    Rachael Graham is a 71 y.o. 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    Being seen for hyperlipidemia, medical management of CAD.  LDL previously not at goal, started on Lipitor 40 mg daily.  Repeat lipid panel was obtained indicating cholesterol is not well controlled.  She is tolerating Lipitor with no adverse effects.  Feels well, has no concerns at this time   Prior notes Echo 01/2022 EF 60 to 65% Coronary CTA 12/2021 calcium score 603, mild stenosis in proximal LAD and RCA.  Past Medical History:  Diagnosis Date   Arthritis    knee, s/p steroid injection Dr. Alvan Dame   Chicken pox    Colon polyp    GERD (gastroesophageal reflux disease)    PONV (postoperative nausea and vomiting)    after knee surgery   Trigger finger    Followed at [facility], Dr. Veronia Beets   Vertigo 09/2017   1 episode    Past Surgical History:  Procedure Laterality Date   AUGMENTATION MAMMAPLASTY Bilateral 1988   BREAST BIOPSY     bilateral, both benign   COLONOSCOPY WITH PROPOFOL N/A 02/21/2021   Procedure: COLONOSCOPY WITH BIOPSY;  Surgeon: Lucilla Lame, MD;  Location: [place];  Service: Endoscopy;  Laterality: N/A;   KNEE ARTHROSCOPY     POLYPECTOMY N/A 02/21/2021   Procedure: POLYPECTOMY;  Surgeon: Lucilla Lame, MD;  Location: [facility];  Service: Endoscopy;  Laterality: N/A;   TONSILLECTOMY AND ADENOIDECTOMY  1964   TRIGGER FINGER RELEASE     Dr. Veronia Beets   TUBAL  LIGATION     VAGINAL DELIVERY      Current Medications: Current Meds  Medication Sig   aspirin EC 81 MG tablet Take 1 tablet (81 mg total) by mouth daily. Swallow whole.   calcium carbonate (TUMS - DOSED IN MG ELEMENTAL CALCIUM) 500 MG chewable tablet Chew 1 tablet by mouth as needed for indigestion or heartburn.   ibuprofen (ADVIL) 100 MG tablet Take 100 mg by mouth as needed for fever. Take three tablets daily as needed.   LORazepam (ATIVAN) 0.5 MG tablet Take 1 tablet (0.5 mg total) by mouth 2 (two) times daily as needed for anxiety.   pantoprazole (PROTONIX) 20 MG tablet TAKE ONE TABLET BY MOUTH TWICE A DAY BEFORE MEALS     Allergies:   Patient has no known allergies.   Social History   Socioeconomic History   Marital status: Married    Spouse name: Not on file   Number of children: Not on file   Years of education: Not on file   Highest education level: Not on file  Occupational History   Not on file  Tobacco Use   Smoking status: Former    Types: Cigarettes    Quit date: 32    Years since quitting: 45.5   Smokeless tobacco: Never  Vaping Use   Vaping Use: Never used  Substance  and Sexual Activity   Alcohol use: Yes    Alcohol/week: 6.0 standard drinks of alcohol    Types: 6 Cans of beer per week    Comment: occaisionaly   Drug use: No   Sexual activity: Not on file  Other Topics Concern   Not on file  Social History Narrative   Lives in [place]. From [place]. Two children in [place].      Diet - Regular   Exercise - tennis   Social Determinants of Health   Financial Resource Strain: Low Risk  (03/29/2020)   Overall Financial Resource Strain (CARDIA)    Difficulty of Paying Living Expenses: Not hard at all  Food Insecurity: No Food Insecurity (03/29/2020)   Hunger Vital Sign    Worried About Running Out of Food in the Last Year: Never true    Ran Out of Food in the Last Year: Never true  Transportation Needs: No Transportation Needs (03/29/2020)   PRAPARE -  Hydrologist (Medical): No    Lack of Transportation (Non-Medical): No  Physical Activity: Sufficiently Active (03/29/2020)   Exercise Vital Sign    Days of Exercise per Week: 5 days    Minutes of Exercise per Session: 30 min  Stress: No Stress Concern Present (03/29/2020)   [place]    Feeling of Stress : Not at all  Social Connections: Unknown (03/29/2020)   Social Connection and Isolation Panel [NHANES]    Frequency of Communication with Friends and Family: More than three times a week    Frequency of Social Gatherings with Friends and Family: Not on file    Attends Religious Services: Not on file    Active Member of Clubs or Organizations: Yes    Attends Archivist Meetings: Not on file    Marital Status: Married     Family History: The patient's family history includes Arthritis in her father, maternal grandfather, maternal grandmother, and mother; Cancer in her maternal grandfather and maternal grandmother; Crohn's disease in her sister; Heart disease in her father and mother; Hyperlipidemia in her father and mother; Hypertension in her father and mother; Obesity in her sister; Parkinsonism in her father; Stroke in her mother. There is no history of Breast cancer.  ROS:   Please see the history of present illness.     All other systems reviewed and are negative.  EKGs/Labs/Other Studies Reviewed:    The following studies were reviewed today:   EKG:  EKG is  ordered today.  The ekg ordered today demonstrates normal sinus rhythm, normal ECG  Recent Labs: 09/26/2021: Hemoglobin 13.6; Platelets 257.0; TSH 1.36 11/02/2021: ALT 17 01/05/2022: BUN 12; Creatinine, Ser 0.58; Potassium 5.0; Sodium 141  Recent Lipid Panel    Component Value Date/Time   CHOL 150 06/21/2022 0806   TRIG 39 06/21/2022 0806   HDL 81 06/21/2022 0806   CHOLHDL 1.9 06/21/2022 0806   VLDL 8 06/21/2022 0806    LDLCALC 61 06/21/2022 0806   LDLDIRECT 156.2 11/10/2013 0807     Risk Assessment/Calculations:          Physical Exam:    VS:  BP 122/70 (BP Location: Left Arm, Patient Position: Sitting, Cuff Size: Normal)   Pulse 67   Ht '5\' 3"'$  (1.6 m)   Wt 130 lb 3.2 oz (59.1 kg)   SpO2 94%   BMI 23.06 kg/m     Wt Readings from Last 3 Encounters:  06/26/22  130 lb 3.2 oz (59.1 kg)  02/06/22 133 lb (60.3 kg)  01/05/22 131 lb 3.2 oz (59.5 kg)     GEN:  Well nourished, well developed in no acute distress HEENT: Normal NECK: No JVD; No carotid bruits CARDIAC: RRR, no murmurs, rubs, gallops RESPIRATORY:  Clear to auscultation without rales, wheezing or rhonchi  ABDOMEN: Soft, non-tender, non-distended MUSCULOSKELETAL:  No edema; No deformity  SKIN: Warm and dry NEUROLOGIC:  Alert and oriented x 3 PSYCHIATRIC:  Normal affect   ASSESSMENT:    1. Coronary artery disease involving native coronary artery of native heart without angina pectoris   2. Pure hypercholesterolemia    PLAN:    In order of problems listed above:  Nonobstructive CAD, 25% RCA, 25 to 49% LAD.  Denies chest pain.  Echo EF 55 to 60%.  Continue aspirin 81 mg, Lipitor 40 mg daily. Hyperlipidemia, LDL now at goal, cont Lipitor 40 mg daily,.  Follow-up in 12 months.      Medication Adjustments/Labs and Tests Ordered: Current medicines are reviewed at length with the patient today.  Concerns regarding medicines are outlined above.  Orders Placed This Encounter  Procedures   EKG 12-Lead   No orders of the defined types were placed in this encounter.   Patient Instructions  Medication Instructions:  Your physician recommends that you continue on your current medications as directed. Please refer to the Current Medication list given to you today.  *If you need a refill on your cardiac medications before your next appointment, please call your pharmacy*   Lab Work: None ordered If you have labs (blood work)  drawn today and your tests are completely normal, you will receive your results only by: [place] (if you have MyChart) OR A paper copy in the mail If you have any lab test that is abnormal or we need to change your treatment, we will call you to review the results.   Testing/Procedures: None ordered   Follow-Up: At [facility], you and your health needs are our priority.  As part of our continuing mission to provide you with exceptional heart care, we have created designated Provider Care Teams.  These Care Teams include your primary Cardiologist (physician) and Advanced Practice Providers (APPs -  Physician Assistants and Nurse Practitioners) who all work together to provide you with the care you need, when you need it.  We recommend signing up for the patient portal called "MyChart".  Sign up information is provided on this After Visit Summary.  MyChart is used to connect with patients for Virtual Visits (Telemedicine).  Patients are able to view lab/test results, encounter notes, upcoming appointments, etc.  Non-urgent messages can be sent to your provider as well.   To learn more about what you can do with MyChart, go to [email or web address].    Your next appointment:   1 year(s)  The format for your next appointment:   In Person  Provider:   You may see Kate Sable, MD or one of the following Advanced Practice Providers on your designated Care Team:   Murray Hodgkins, NP Christell Faith, PA-C Cadence Kathlen Mody, [place]    Other Instructions   Important Information About Sugar         Signed, Kate Sable, MD  06/26/2022 3:12 PM    [place]

## 2022-06-26 NOTE — Patient Instructions (Signed)

## 2022-07-19 ENCOUNTER — Other Ambulatory Visit: Payer: Self-pay

## 2022-07-19 MED ORDER — ATORVASTATIN CALCIUM 40 MG PO TABS: 40.0000 mg | ORAL_TABLET | Freq: Every day | ORAL | 3 refills | Status: DC

## 2022-08-18 ENCOUNTER — Other Ambulatory Visit: Payer: Self-pay | Admitting: Family

## 2022-08-18 DIAGNOSIS — R0789 Other chest pain: Secondary | ICD-10-CM

## 2022-10-17 ENCOUNTER — Other Ambulatory Visit: Payer: Self-pay | Admitting: *Deleted

## 2022-10-17 MED ORDER — ATORVASTATIN CALCIUM 40 MG PO TABS: 40.0000 mg | ORAL_TABLET | Freq: Every day | ORAL | 3 refills | Status: DC

## 2022-10-18 DIAGNOSIS — H524 Presbyopia: Secondary | ICD-10-CM | POA: Diagnosis not present

## 2022-11-24 ENCOUNTER — Other Ambulatory Visit: Payer: Self-pay | Admitting: Family

## 2022-11-24 DIAGNOSIS — R0789 Other chest pain: Secondary | ICD-10-CM

## 2022-11-27 DIAGNOSIS — Z0100 Encounter for examination of eyes and vision without abnormal findings: Secondary | ICD-10-CM | POA: Diagnosis not present

## 2023-01-12 ENCOUNTER — Encounter: Payer: Self-pay | Admitting: Family

## 2023-01-12 ENCOUNTER — Ambulatory Visit (INDEPENDENT_AMBULATORY_CARE_PROVIDER_SITE_OTHER): Payer: Medicare HMO | Admitting: Family

## 2023-01-12 VITALS — BP 118/70 | HR 70 | Temp 98.1°F | Ht 63.0 in | Wt 128.9 lb

## 2023-01-12 DIAGNOSIS — I2510 Atherosclerotic heart disease of native coronary artery without angina pectoris: Secondary | ICD-10-CM | POA: Insufficient documentation

## 2023-01-12 DIAGNOSIS — I2584 Coronary atherosclerosis due to calcified coronary lesion: Secondary | ICD-10-CM | POA: Diagnosis not present

## 2023-01-12 DIAGNOSIS — Z1231 Encounter for screening mammogram for malignant neoplasm of breast: Secondary | ICD-10-CM | POA: Diagnosis not present

## 2023-01-12 DIAGNOSIS — F419 Anxiety disorder, unspecified: Secondary | ICD-10-CM

## 2023-01-12 DIAGNOSIS — R7309 Other abnormal glucose: Secondary | ICD-10-CM | POA: Diagnosis not present

## 2023-01-12 DIAGNOSIS — Z780 Asymptomatic menopausal state: Secondary | ICD-10-CM | POA: Diagnosis not present

## 2023-01-12 DIAGNOSIS — R69 Illness, unspecified: Secondary | ICD-10-CM | POA: Diagnosis not present

## 2023-01-12 DIAGNOSIS — Z0000 Encounter for general adult medical examination without abnormal findings: Secondary | ICD-10-CM

## 2023-01-12 LAB — POCT GLYCOSYLATED HEMOGLOBIN (HGB A1C): Hemoglobin A1C: 5.6 % (ref 4.0–5.6)

## 2023-01-12 MED ORDER — LORAZEPAM 0.5 MG PO TABS: 0.5000 mg | ORAL_TABLET | Freq: Two times a day (BID) | ORAL | 1 refills | Status: DC | PRN

## 2023-01-12 NOTE — Patient Instructions (Addendum)
Please call  and schedule your 3D mammogram and /or bone density scan as we discussed.   [facility]  ( new location in 2023)  [address], [place], [place] [address]  [place]  Always a pleasure seeing you!

## 2023-01-12 NOTE — Assessment & Plan Note (Signed)
Chronic, stable.  She continues to follow with Dr Charlestine Night.  Continue aspirin 81 mg daily, Lipitor 40 mg qd.

## 2023-01-12 NOTE — Progress Notes (Signed)
Assessment & Plan:  Anxiety Assessment & Plan: Excellent control.  Very rare use of Ativan 0.5 mg.  Refilled today I looked up patient on [place] Controlled Substances Reporting System PMP AWARE and saw no activity that raised concern of inappropriate use.    Orders: -     LORazepam; Take 1 tablet (0.5 mg total) by mouth 2 (two) times daily as needed for anxiety.  Dispense: 30 tablet; Refill: 1  Elevated glucose -     POCT glycosylated hemoglobin (Hb A1C)  Encounter for screening mammogram for malignant neoplasm of breast -     3D Screening Mammogram, Left and Right; Future -     Ambulatory referral to Obstetrics / Gynecology  Routine general medical examination at a health care facility -     3D Screening Mammogram w/Implants, Left and Right; Future  Asymptomatic postmenopausal state -     DG Bone Density; Future  Coronary artery disease due to calcified coronary lesion Assessment & Plan: Chronic, stable.  She continues to follow with Dr Charlestine Night.  Continue aspirin 81 mg daily, Lipitor 40 mg qd.       Return precautions given.   Risks, benefits, and alternatives of the medications and treatment plan prescribed today were discussed, and patient expressed understanding.   Education regarding symptom management and diagnosis given to patient on AVS either electronically or printed.  No follow-ups on file.  Mable Paris, FNP  Subjective:    Patient ID: Rachael Graham, female    DOB: 02/26/1951, 72 y.o.   MRN: [id number]  CC: Rachael Graham is a 72 y.o. female who presents today for follow up.   HPI: Feels well today.  No new complaints   GAD- uses ativan very sparingly.  She would like refill today   Follow-up cardiology 06/2022 for nonobstructive CAD.  Continue aspirin 81 mg, Lipitor 40 mg.  No chest pain  Allergies: Patient has no known allergies. Current Outpatient Medications on File Prior to Visit  Medication Sig Dispense Refill   aspirin EC 81 MG tablet Take 1  tablet (81 mg total) by mouth daily. Swallow whole. 90 tablet 3   atorvastatin (LIPITOR) 40 MG tablet Take 1 tablet (40 mg total) by mouth daily. 90 tablet 3   calcium carbonate (TUMS - DOSED IN MG ELEMENTAL CALCIUM) 500 MG chewable tablet Chew 1 tablet by mouth as needed for indigestion or heartburn.     ibuprofen (ADVIL) 100 MG tablet Take 100 mg by mouth as needed for fever. Take three tablets daily as needed.     pantoprazole (PROTONIX) 20 MG tablet TAKE ONE TABLET BY MOUTH TWICE A DAY BEFORE MEALS 120 tablet 0   No current facility-administered medications on file prior to visit.    Review of Systems  Constitutional:  Negative for chills and fever.  Respiratory:  Negative for cough.   Cardiovascular:  Negative for chest pain and palpitations.  Gastrointestinal:  Negative for nausea and vomiting.      Objective:    BP 118/70   Pulse 70   Temp 98.1 F (36.7 C) (Oral)   Ht '5\' 3"'$  (1.6 m)   Wt 128 lb 14.4 oz (58.5 kg)   SpO2 96%   BMI 22.83 kg/m  BP Readings from Last 3 Encounters:  01/12/23 118/70  06/26/22 122/70  02/06/22 136/70   Wt Readings from Last 3 Encounters:  01/12/23 128 lb 14.4 oz (58.5 kg)  06/26/22 130 lb 3.2 oz (59.1 kg)  02/06/22 133 lb (60.3 kg)  Physical Exam Vitals reviewed.  Constitutional:      Appearance: She is well-developed.  Eyes:     Conjunctiva/sclera: Conjunctivae normal.  Cardiovascular:     Rate and Rhythm: Normal rate and regular rhythm.     Pulses: Normal pulses.     Heart sounds: Normal heart sounds.  Pulmonary:     Effort: Pulmonary effort is normal.     Breath sounds: Normal breath sounds. No wheezing, rhonchi or rales.  Skin:    General: Skin is warm and dry.  Neurological:     Mental Status: She is alert.  Psychiatric:        Speech: Speech normal.        Behavior: Behavior normal.        Thought Content: Thought content normal.

## 2023-01-12 NOTE — Assessment & Plan Note (Addendum)
Excellent control.  Very rare use of Ativan 0.5 mg.  Refilled today I looked up patient on [place] Controlled Substances Reporting System PMP AWARE and saw no activity that raised concern of inappropriate use.

## 2023-02-27 ENCOUNTER — Telehealth: Payer: Self-pay | Admitting: Family

## 2023-02-27 DIAGNOSIS — R0789 Other chest pain: Secondary | ICD-10-CM

## 2023-02-27 MED ORDER — PANTOPRAZOLE SODIUM 20 MG PO TBEC: DELAYED_RELEASE_TABLET | ORAL | 3 refills | Status: DC

## 2023-02-27 NOTE — Addendum Note (Signed)
Addended by: Burnard Hawthorne on: 02/27/2023 11:23 AM   Modules accepted: Orders

## 2023-02-27 NOTE — Telephone Encounter (Signed)
refilled 

## 2023-02-27 NOTE — Telephone Encounter (Signed)
Pt need a refill on pantoprazole [facility]

## 2023-03-01 DIAGNOSIS — Z0100 Encounter for examination of eyes and vision without abnormal findings: Secondary | ICD-10-CM | POA: Diagnosis not present

## 2023-03-12 ENCOUNTER — Ambulatory Visit
Admission: RE | Admit: 2023-03-12 | Discharge: 2023-03-12 | Disposition: A | Discharge status: Home / Self Care | Payer: Medicare HMO | Source: Ambulatory Visit | Attending: Family | Admitting: Family

## 2023-03-12 DIAGNOSIS — Z0000 Encounter for general adult medical examination without abnormal findings: Secondary | ICD-10-CM | POA: Diagnosis not present

## 2023-03-12 DIAGNOSIS — R92333 Mammographic heterogeneous density, bilateral breasts: Secondary | ICD-10-CM | POA: Insufficient documentation

## 2023-03-12 DIAGNOSIS — Z780 Asymptomatic menopausal state: Secondary | ICD-10-CM | POA: Diagnosis not present

## 2023-03-12 DIAGNOSIS — Z1231 Encounter for screening mammogram for malignant neoplasm of breast: Secondary | ICD-10-CM | POA: Insufficient documentation

## 2023-03-13 ENCOUNTER — Encounter: Payer: Self-pay | Admitting: Obstetrics and Gynecology

## 2023-03-13 ENCOUNTER — Ambulatory Visit: Payer: Medicare HMO | Admitting: Obstetrics and Gynecology

## 2023-03-13 VITALS — BP 137/69 | HR 75 | Ht 63.0 in | Wt 130.5 lb

## 2023-03-13 DIAGNOSIS — R351 Nocturia: Secondary | ICD-10-CM

## 2023-03-13 DIAGNOSIS — Z01419 Encounter for gynecological examination (general) (routine) without abnormal findings: Secondary | ICD-10-CM | POA: Diagnosis not present

## 2023-03-13 DIAGNOSIS — Z7689 Persons encountering health services in other specified circumstances: Secondary | ICD-10-CM

## 2023-03-13 NOTE — Progress Notes (Signed)
GYNECOLOGY CLINIC PROGRESS NOTE  Subjective:    Rachael Graham is a 72 y.o. G16P2002 female here to establish care and for a routine exam.  Current complaints: none.     Gynecologic History No LMP recorded. Patient is postmenopausal. Contraception: post menopausal status Last Pap: 01/05/2015. Results were: normal Last mammogram: performed yesterday Results were: normal Last bone density scan: performed yesterday    Obstetric History OB History  Gravida Para Term Preterm AB Living  2 2 2     2   SAB IAB Ectopic Multiple Live Births          2    # Outcome Date GA Lbr Len/2nd Weight Sex Delivery Anes PTL Lv  2 Term           1 Term              Past Medical History:  Diagnosis Date   Arthritis    knee, s/p steroid injection Dr. Alvan Dame   Chicken pox    Colon polyp    GERD (gastroesophageal reflux disease)    PONV (postoperative nausea and vomiting)    after knee surgery   Trigger finger    Followed at [facility], Dr. Veronia Beets   Vertigo 09/2017   1 episode    Family History  Problem Relation Age of Onset   Arthritis Mother    Hyperlipidemia Mother    Heart disease Mother    Hypertension Mother    Stroke Mother    Arthritis Father    Hyperlipidemia Father    Heart disease Father    Hypertension Father    Parkinsonism Father    Crohn's disease Sister    Obesity Sister    Arthritis Maternal Grandmother    Cancer Maternal Grandmother        colon   Arthritis Maternal Grandfather    Cancer Maternal Grandfather        colon   Breast cancer Neg Hx     Past Surgical History:  Procedure Laterality Date   AUGMENTATION MAMMAPLASTY Bilateral 1988   BREAST BIOPSY     bilateral, both benign   COLONOSCOPY WITH PROPOFOL N/A 02/21/2021   Procedure: COLONOSCOPY WITH BIOPSY;  Surgeon: Lucilla Lame, MD;  Location: [facility];  Service: Endoscopy;  Laterality: N/A;   KNEE ARTHROSCOPY     POLYPECTOMY N/A 02/21/2021   Procedure: POLYPECTOMY;  Surgeon:  Lucilla Lame, MD;  Location: [facility];  Service: Endoscopy;  Laterality: N/A;   TONSILLECTOMY AND ADENOIDECTOMY  1964   TRIGGER FINGER RELEASE     Dr. Veronia Beets   TUBAL LIGATION     VAGINAL DELIVERY      Social History   Socioeconomic History   Marital status: Married    Spouse name: Not on file   Number of children: Not on file   Years of education: Not on file   Highest education level: Not on file  Occupational History   Not on file  Tobacco Use   Smoking status: Former    Types: Cigarettes    Quit date: 45    Years since quitting: 46.2   Smokeless tobacco: Never  Vaping Use   Vaping Use: Never used  Substance and Sexual Activity   Alcohol use: Yes    Alcohol/week: 6.0 standard drinks of alcohol    Types: 6 Cans of beer per week    Comment: occaisionaly   Drug use: No   Sexual activity: Not on file  Other Topics Concern  Not on file  Social History Narrative   Lives in [place]. From [place]. Two children in [place].      Diet - Regular   Exercise - tennis   Social Determinants of Health   Financial Resource Strain: Low Risk  (03/29/2020)   Overall Financial Resource Strain (CARDIA)    Difficulty of Paying Living Expenses: Not hard at all  Food Insecurity: No Food Insecurity (03/29/2020)   Hunger Vital Sign    Worried About Running Out of Food in the Last Year: Never true    Ran Out of Food in the Last Year: Never true  Transportation Needs: No Transportation Needs (03/29/2020)   PRAPARE - Hydrologist (Medical): No    Lack of Transportation (Non-Medical): No  Physical Activity: Sufficiently Active (03/29/2020)   Exercise Vital Sign    Days of Exercise per Week: 5 days    Minutes of Exercise per Session: 30 min  Stress: No Stress Concern Present (03/29/2020)   [place]    Feeling of Stress : Not at all  Social Connections: Unknown (03/29/2020)   Social  Connection and Isolation Panel [NHANES]    Frequency of Communication with Friends and Family: More than three times a week    Frequency of Social Gatherings with Friends and Family: Not on file    Attends Religious Services: Not on file    Active Member of Clubs or Organizations: Yes    Attends Archivist Meetings: Not on file    Marital Status: Married  Intimate Partner Violence: Not At Risk (03/29/2020)   Humiliation, Afraid, Rape, and Kick questionnaire    Fear of Current or Ex-Partner: No    Emotionally Abused: No    Physically Abused: No    Sexually Abused: No    Current Outpatient Medications on File Prior to Visit  Medication Sig Dispense Refill   aspirin EC 81 MG tablet Take 1 tablet (81 mg total) by mouth daily. Swallow whole. 90 tablet 3   atorvastatin (LIPITOR) 40 MG tablet Take 1 tablet (40 mg total) by mouth daily. 90 tablet 3   calcium carbonate (TUMS - DOSED IN MG ELEMENTAL CALCIUM) 500 MG chewable tablet Chew 1 tablet by mouth as needed for indigestion or heartburn.     ibuprofen (ADVIL) 100 MG tablet Take 100 mg by mouth as needed for fever. Take three tablets daily as needed.     LORazepam (ATIVAN) 0.5 MG tablet Take 1 tablet (0.5 mg total) by mouth 2 (two) times daily as needed for anxiety. 30 tablet 1   pantoprazole (PROTONIX) 20 MG tablet TAKE ONE TABLET BY MOUTH TWICE A DAY BEFORE MEALS ( 180 tablet 3   No current facility-administered medications on file prior to visit.    No Known Allergies    Review of Systems A comprehensive review of systems was negative except for: Genitourinary: positive for nocturia    Objective:  BP 137/69   Pulse 75   Ht 5\' 3"  (1.6 m)   Wt 130 lb 8 oz (59.2 kg)   BMI 23.12 kg/m   General Appearance:    Alert, cooperative, no distress, appears stated age  Head:    Normocephalic, without obvious abnormality, atraumatic  Eyes:    PERRL, conjunctiva/corneas clear, EOM's intact, both eyes  Ears:    Normal TM's and  external ear canals, both ears  Nose:   Nares normal, septum midline, mucosa normal, no drainage  or sinus tenderness  Throat:   Lips, mucosa, and tongue normal; teeth and gums normal  Neck:   Supple, symmetrical, trachea midline, no adenopathy;    thyroid:  no enlargement/tenderness/nodules; no carotid   bruit or JVD  Back:     Symmetric, no curvature, ROM normal, no CVA tenderness  Lungs:     Clear to auscultation bilaterally, respirations unlabored  Chest Wall:    No tenderness or deformity   Heart:    Regular rate and rhythm, S1 and S2 normal, no murmur, rub   or gallop  Breast Exam:    No tenderness, masses, or nipple abnormality  Abdomen:     Soft, non-tender, bowel sounds active all four quadrants,    no masses, no organomegaly  Genitalia:    Normal female without lesion, discharge or tenderness. Mild vaginal atrophy present.   Rectal:    Normal external sphincter, no hemorrhoids  Extremities:   Extremities normal, atraumatic, no cyanosis or edema  Pulses:   2+ and symmetric all extremities  Skin:   Skin color, texture, turgor normal, no rashes or lesions  Lymph nodes:   Cervical, supraclavicular, and axillary nodes normal  Neurologic:   CNII-XII intact, normal strength, sensation and reflexes    throughout     Assessment:   1. Well woman exam with routine gynecological exam   2. Establishing care with new doctor, encounter for   3. Nocturia     Plan:   - Education reviewed: calcium supplements, self breast exams, weight bearing exercise, and healthy diet. - Mammogram ordered. - Labs performed by PCP.  - Discussed nocturia, encouraged limiting fluids in evening, limit intake of bladder irritants, pelvic floor physical therapy. Has tried meds in the past (only used for several weeks due to significant constipation).  - Follow up in 1 year or every other year for GYN exams.    Rubie Maid, MD [facility]

## 2023-03-15 ENCOUNTER — Encounter: Payer: Self-pay | Admitting: Family

## 2023-03-16 ENCOUNTER — Other Ambulatory Visit: Payer: Self-pay | Admitting: Family

## 2023-03-16 DIAGNOSIS — K219 Gastro-esophageal reflux disease without esophagitis: Secondary | ICD-10-CM

## 2023-03-16 MED ORDER — PANTOPRAZOLE SODIUM 40 MG PO TBEC: 40.0000 mg | DELAYED_RELEASE_TABLET | Freq: Every day | ORAL | 3 refills | Status: DC

## 2023-03-19 ENCOUNTER — Other Ambulatory Visit: Payer: Self-pay

## 2023-03-19 DIAGNOSIS — R079 Chest pain, unspecified: Secondary | ICD-10-CM

## 2023-03-19 DIAGNOSIS — I2510 Atherosclerotic heart disease of native coronary artery without angina pectoris: Secondary | ICD-10-CM

## 2023-03-19 NOTE — Telephone Encounter (Signed)
Spoke to pt and ordered labs scheduled appt for 03/27/23 @ 8:15

## 2023-03-27 ENCOUNTER — Other Ambulatory Visit (INDEPENDENT_AMBULATORY_CARE_PROVIDER_SITE_OTHER): Payer: Medicare HMO

## 2023-03-27 DIAGNOSIS — I2510 Atherosclerotic heart disease of native coronary artery without angina pectoris: Secondary | ICD-10-CM | POA: Diagnosis not present

## 2023-03-27 DIAGNOSIS — I2584 Coronary atherosclerosis due to calcified coronary lesion: Secondary | ICD-10-CM | POA: Diagnosis not present

## 2023-03-27 DIAGNOSIS — R079 Chest pain, unspecified: Secondary | ICD-10-CM | POA: Diagnosis not present

## 2023-03-27 LAB — BASIC METABOLIC PANEL
BUN: 18 mg/dL (ref 6–23)
CO2: 32 mEq/L (ref 19–32)
Calcium: 9.3 mg/dL (ref 8.4–10.5)
Chloride: 103 mEq/L (ref 96–112)
Creatinine, Ser: 0.66 mg/dL (ref 0.40–1.20)
GFR: 89.51 mL/min (ref >60.00)
Glucose, Bld: 81 mg/dL (ref 70–99)
Potassium: 4.4 mEq/L (ref 3.5–5.1)
Sodium: 140 mEq/L (ref 135–145)

## 2023-03-27 LAB — VITAMIN D 25 HYDROXY (VIT D DEFICIENCY, FRACTURES): VITD: 35.7 ng/mL (ref 30.00–100.00)

## 2023-04-05 DIAGNOSIS — D2372 Other benign neoplasm of skin of left lower limb, including hip: Secondary | ICD-10-CM | POA: Diagnosis not present

## 2023-04-05 DIAGNOSIS — M79672 Pain in left foot: Secondary | ICD-10-CM | POA: Diagnosis not present

## 2023-04-19 DIAGNOSIS — M79672 Pain in left foot: Secondary | ICD-10-CM | POA: Diagnosis not present

## 2023-04-19 DIAGNOSIS — D2372 Other benign neoplasm of skin of left lower limb, including hip: Secondary | ICD-10-CM | POA: Diagnosis not present

## 2023-10-15 ENCOUNTER — Other Ambulatory Visit: Payer: Self-pay

## 2023-10-15 MED ORDER — ATORVASTATIN CALCIUM 40 MG PO TABS: 40.0000 mg | ORAL_TABLET | Freq: Every day | ORAL | 0 refills | Status: DC

## 2023-12-13 ENCOUNTER — Telehealth: Payer: Self-pay | Admitting: Cardiology

## 2023-12-13 NOTE — Telephone Encounter (Signed)
*  STAT* If patient is at the pharmacy, call can be transferred to refill team.   1. Which medications need to be refilled? (please list name of each medication and dose if known) atorvastatin (LIPITOR) 40 MG tablet    2. Would you like to learn more about the convenience, safety, & potential cost savings by using the [facility]?      3. Are you open to using the [facility] (Type [facility]. ).   4. Which pharmacy/location (including street and city if local pharmacy) is medication to be sent to? [facility] [address] - [place], [place] - [address]    5. Do they need a 30 day or 90 day supply? 90

## 2023-12-13 NOTE — Telephone Encounter (Signed)
Please contact pt for future appointment. Pt overdue for follow up.

## 2023-12-14 NOTE — Telephone Encounter (Signed)
Appointment scheduled for 02/05/24

## 2023-12-17 MED ORDER — ATORVASTATIN CALCIUM 40 MG PO TABS: 40.0000 mg | ORAL_TABLET | Freq: Every day | ORAL | 0 refills | Status: DC

## 2023-12-17 NOTE — Telephone Encounter (Signed)
Requested Prescriptions   Signed Prescriptions Disp Refills   atorvastatin (LIPITOR) 40 MG tablet 90 tablet 0    Sig: Take 1 tablet (40 mg total) by mouth daily.    Authorizing Provider: Kate Sable    Ordering User: Britt Bottom

## 2024-01-10 ENCOUNTER — Other Ambulatory Visit: Payer: Self-pay

## 2024-01-10 NOTE — Telephone Encounter (Signed)
This is a [place] pt 

## 2024-01-14 ENCOUNTER — Other Ambulatory Visit: Payer: Self-pay

## 2024-01-14 MED ORDER — ATORVASTATIN CALCIUM 40 MG PO TABS: 40.0000 mg | ORAL_TABLET | Freq: Every day | ORAL | 3 refills | Status: DC

## 2024-02-05 ENCOUNTER — Encounter: Payer: Self-pay | Admitting: Cardiology

## 2024-02-05 ENCOUNTER — Ambulatory Visit: Payer: Medicare HMO | Attending: Cardiology | Admitting: Cardiology

## 2024-02-05 VITALS — BP 132/76 | HR 63 | Ht 63.0 in | Wt 130.0 lb

## 2024-02-05 DIAGNOSIS — E7800 Pure hypercholesterolemia, unspecified: Secondary | ICD-10-CM

## 2024-02-05 DIAGNOSIS — I2510 Atherosclerotic heart disease of native coronary artery without angina pectoris: Secondary | ICD-10-CM

## 2024-02-05 NOTE — Patient Instructions (Signed)
Medication Instructions:   Your physician recommends that you continue on your current medications as directed. Please refer to the Current Medication list given to you today.  *If you need a refill on your cardiac medications before your next appointment, please call your pharmacy*   Lab Work:  None Ordered  If you have labs (blood work) drawn today and your tests are completely normal, you will receive your results only by: MyChart Message (if you have MyChart) OR A paper copy in the mail If you have any lab test that is abnormal or we need to change your treatment, we will call you to review the results.   Testing/Procedures:  None Ordered    Follow-Up: At [facility], you and your health needs are our priority.  As part of our continuing mission to provide you with exceptional heart care, we have created designated Provider Care Teams.  These Care Teams include your primary Cardiologist (physician) and Advanced Practice Providers (APPs -  Physician Assistants and Nurse Practitioners) who all work together to provide you with the care you need, when you need it.  We recommend signing up for the patient portal called "MyChart".  Sign up information is provided on this After Visit Summary.  MyChart is used to connect with patients for Virtual Visits (Telemedicine).  Patients are able to view lab/test results, encounter notes, upcoming appointments, etc.  Non-urgent messages can be sent to your provider as well.   To learn more about what you can do with MyChart, go to [email or web address].    Your next appointment:   12 month(s)  Provider:   You may see Debbe Odea, MD or one of the following Advanced Practice Providers on your designated Care Team:   Nicolasa Ducking, NP Eula Listen, PA-C Cadence Fransico Michael, PA-C Charlsie Quest, NP Carlos Levering, NP

## 2024-02-05 NOTE — Progress Notes (Signed)
Cardiology Office Note:    Date:  02/05/2024   ID:  Rachael Graham, DOB [birth year], MRN [id number]  PCP:  Allegra Grana, FNP   [facility] Providers Cardiologist:  Debbe Odea, MD     Referring MD: Allegra Grana, FNP   Chief Complaint  Patient presents with   Follow-up    Patient denies new or acute cardiac problems/concerns today.      History of Present Illness:    Rachael Graham is a 73 y.o. 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    Feels well, denies chest pain or shortness of breath.  Compliant with aspirin and Lipitor as prescribed.  Fairly active with no issues.  Planning on being more active but limited by knee pain secondary to arthritis.  Prior notes Echo 01/2022 EF 60 to 65% Coronary CTA 12/2021 calcium score 603, mild stenosis in proximal LAD and RCA.  Past Medical History:  Diagnosis Date   Arthritis    knee, s/p steroid injection Dr. Charlann Boxer   Chicken pox    Colon polyp    GERD (gastroesophageal reflux disease)    PONV (postoperative nausea and vomiting)    after knee surgery   Trigger finger    Followed at [facility], Dr. Butler Denmark   Vertigo 09/2017   1 episode    Past Surgical History:  Procedure Laterality Date   AUGMENTATION MAMMAPLASTY Bilateral 1988   BREAST BIOPSY     bilateral, both benign   COLONOSCOPY WITH PROPOFOL N/A 02/21/2021   Procedure: COLONOSCOPY WITH BIOPSY;  Surgeon: Midge Minium, MD;  Location: [facility];  Service: Endoscopy;  Laterality: N/A;   KNEE ARTHROSCOPY     POLYPECTOMY N/A 02/21/2021   Procedure: POLYPECTOMY;  Surgeon: Midge Minium, MD;  Location: [facility];  Service: Endoscopy;  Laterality: N/A;   TONSILLECTOMY AND ADENOIDECTOMY  1964   TRIGGER FINGER RELEASE     Dr. Butler Denmark   TUBAL LIGATION     VAGINAL DELIVERY      Current Medications: Current Meds  Medication Sig   aspirin  EC 81 MG tablet Take 1 tablet (81 mg total) by mouth daily. Swallow whole.   atorvastatin (LIPITOR) 40 MG tablet Take 1 tablet (40 mg total) by mouth daily.   calcium carbonate (TUMS - DOSED IN MG ELEMENTAL CALCIUM) 500 MG chewable tablet Chew 1 tablet by mouth as needed for indigestion or heartburn.   ibuprofen (ADVIL) 100 MG tablet Take 100 mg by mouth as needed for fever. Take three tablets daily as needed.   LORazepam (ATIVAN) 0.5 MG tablet Take 1 tablet (0.5 mg total) by mouth 2 (two) times daily as needed for anxiety.   pantoprazole (PROTONIX) 40 MG tablet Take 1 tablet (40 mg total) by mouth daily.     Allergies:   Patient has no known allergies.   Social History   Socioeconomic History   Marital status: Married    Spouse name: Not on file   Number of children: Not on file   Years of education: Not on file   Highest education level: Not on file  Occupational History   Not on file  Tobacco Use   Smoking status: Former    Current packs/day: 0.00    Types: Cigarettes    Quit date: 1978    Years since quitting: 47.1   Smokeless tobacco: Never  Vaping Use   Vaping status: Never Used  Substance and Sexual Activity   Alcohol use: Yes    Alcohol/week: 6.0 standard drinks of alcohol    Types: 6 Cans of beer per week    Comment: occaisionaly   Drug use: No   Sexual activity: Not on file  Other Topics Concern   Not on file  Social History Narrative   Lives in [place]. From [place]. Two children in [place].      Diet - Regular   Exercise - tennis   Social Drivers of Health   Financial Resource Strain: Low Risk  (03/29/2020)   Overall Financial Resource Strain (CARDIA)    Difficulty of Paying Living Expenses: Not hard at all  Food Insecurity: No Food Insecurity (03/29/2020)   Hunger Vital Sign    Worried About Running Out of Food in the Last Year: Never true    Ran Out of Food in the Last Year: Never true  Transportation Needs: No Transportation Needs (03/29/2020)   PRAPARE -  Administrator, Civil Service (Medical): No    Lack of Transportation (Non-Medical): No  Physical Activity: Sufficiently Active (03/29/2020)   Exercise Vital Sign    Days of Exercise per Week: 5 days    Minutes of Exercise per Session: 30 min  Stress: No Stress Concern Present (03/29/2020)   [employer] of Occupational Health - Occupational Stress Questionnaire    Feeling of Stress : Not at all  Social Connections: Unknown (03/29/2020)   Social Connection and Isolation Panel [NHANES]    Frequency of Communication with Friends and Family: More than three times a week    Frequency of Social Gatherings with Friends and Family: Not on file    Attends Religious Services: Not on file    Active Member of Clubs or Organizations: Yes    Attends Banker Meetings: Not on file    Marital Status: Married     Family History: The patient's family history includes Arthritis in her father, maternal grandfather, maternal grandmother, and mother; Cancer in her maternal grandfather and maternal grandmother; Crohn's disease in her sister; Heart disease in her father and mother; Hyperlipidemia in her father and mother; Hypertension in her father and mother; Obesity in her sister; Parkinsonism in her father; Stroke in her mother. There is no history of Breast cancer.  ROS:   Please see the history of present illness.     All other systems reviewed and are negative.  EKGs/Labs/Other Studies Reviewed:    The following studies were reviewed today:   EKG Interpretation Date/Time:  Tuesday February 05 2024 09:09:28 EST Ventricular Rate:  63 PR Interval:  152 QRS Duration:  82 QT Interval:  396 QTC Calculation: 405 R Axis:   71  Text Interpretation: Normal sinus rhythm Normal ECG Confirmed by Debbe Odea ([id number]) on 02/05/2024 9:20:15 AM    Recent Labs: 03/27/2023: BUN 18; Creatinine, Ser 0.66; Potassium 4.4; Sodium 140  Recent Lipid Panel    Component Value Date/Time    CHOL 150 06/21/2022 0806   TRIG 39 06/21/2022 0806   HDL 81 06/21/2022 0806   CHOLHDL 1.9 06/21/2022 0806   VLDL 8 06/21/2022 0806   LDLCALC 61 06/21/2022 0806   LDLDIRECT 156.2 11/10/2013 0807     Risk Assessment/Calculations:          Physical Exam:    VS:  BP 132/76 (BP Location: Left Arm, Patient Position: Sitting, Cuff Size: Normal)   Pulse 63   Ht 5\' 3"  (1.6 m)   Wt 130  lb (59 kg)   SpO2 98%   BMI 23.03 kg/m     Wt Readings from Last 3 Encounters:  02/05/24 130 lb (59 kg)  03/13/23 130 lb 8 oz (59.2 kg)  01/12/23 128 lb 14.4 oz (58.5 kg)     GEN:  Well nourished, well developed in no acute distress HEENT: Normal NECK: No JVD; No carotid bruits CARDIAC: RRR, no murmurs, rubs, gallops RESPIRATORY:  Clear to auscultation without rales, wheezing or rhonchi  ABDOMEN: Soft, non-tender, non-distended MUSCULOSKELETAL:  No edema; No deformity  SKIN: Warm and dry NEUROLOGIC:  Alert and oriented x 3 PSYCHIATRIC:  Normal affect   ASSESSMENT:    1. Coronary artery disease involving native coronary artery of native heart without angina pectoris   2. Pure hypercholesterolemia    PLAN:    In order of problems listed above:  Nonobstructive CAD, 25% RCA, 25 to 49% LAD.  Denies chest pain.  Echo 2/23 EF 55 to 60%.  Continue aspirin 81 mg, Lipitor 40 mg daily. Hyperlipidemia, LDL controlled.  Cont Lipitor 40 mg daily,.  Follow-up in 12 months.     Medication Adjustments/Labs and Tests Ordered: Current medicines are reviewed at length with the patient today.  Concerns regarding medicines are outlined above.  Orders Placed This Encounter  Procedures   EKG 12-Lead   No orders of the defined types were placed in this encounter.   Patient Instructions  Medication Instructions:   Your physician recommends that you continue on your current medications as directed. Please refer to the Current Medication list given to you today.  *If you need a refill on your cardiac  medications before your next appointment, please call your pharmacy*   Lab Work:  None Ordered  If you have labs (blood work) drawn today and your tests are completely normal, you will receive your results only by: MyChart Message (if you have MyChart) OR A paper copy in the mail If you have any lab test that is abnormal or we need to change your treatment, we will call you to review the results.   Testing/Procedures:  None Ordered    Follow-Up: At [facility], you and your health needs are our priority.  As part of our continuing mission to provide you with exceptional heart care, we have created designated Provider Care Teams.  These Care Teams include your primary Cardiologist (physician) and Advanced Practice Providers (APPs -  Physician Assistants and Nurse Practitioners) who all work together to provide you with the care you need, when you need it.  We recommend signing up for the patient portal called "MyChart".  Sign up information is provided on this After Visit Summary.  MyChart is used to connect with patients for Virtual Visits (Telemedicine).  Patients are able to view lab/test results, encounter notes, upcoming appointments, etc.  Non-urgent messages can be sent to your provider as well.   To learn more about what you can do with MyChart, go to [email or web address].    Your next appointment:   12 month(s)  Provider:   You may see Debbe Odea, MD or one of the following Advanced Practice Providers on your designated Care Team:   Nicolasa Ducking, NP Eula Listen, PA-C Cadence Fransico Michael, PA-C Charlsie Quest, NP Carlos Levering, NP   Signed, Debbe Odea, MD  02/05/2024 10:14 AM    [facility]

## 2024-02-21 DIAGNOSIS — H25813 Combined forms of age-related cataract, bilateral: Secondary | ICD-10-CM | POA: Diagnosis not present

## 2024-03-05 ENCOUNTER — Other Ambulatory Visit: Payer: Self-pay | Admitting: Obstetrics and Gynecology

## 2024-03-05 ENCOUNTER — Other Ambulatory Visit: Payer: Self-pay | Admitting: Family

## 2024-03-05 DIAGNOSIS — Z1231 Encounter for screening mammogram for malignant neoplasm of breast: Secondary | ICD-10-CM

## 2024-03-10 ENCOUNTER — Other Ambulatory Visit: Payer: Self-pay

## 2024-03-10 MED ORDER — ATORVASTATIN CALCIUM 40 MG PO TABS: 40.0000 mg | ORAL_TABLET | Freq: Every day | ORAL | 3 refills | Status: AC

## 2024-03-10 NOTE — Telephone Encounter (Signed)
 This is a Educational psychologist pt

## 2024-03-25 ENCOUNTER — Ambulatory Visit
Admission: RE | Admit: 2024-03-25 | Discharge: 2024-03-25 | Disposition: A | Discharge status: Home / Self Care | Source: Ambulatory Visit | Attending: Obstetrics and Gynecology | Admitting: Obstetrics and Gynecology

## 2024-03-25 ENCOUNTER — Other Ambulatory Visit: Payer: Self-pay | Admitting: Obstetrics and Gynecology

## 2024-03-25 DIAGNOSIS — Z1231 Encounter for screening mammogram for malignant neoplasm of breast: Secondary | ICD-10-CM | POA: Insufficient documentation

## 2024-03-27 ENCOUNTER — Encounter: Payer: Self-pay | Admitting: Obstetrics and Gynecology

## 2024-04-29 ENCOUNTER — Encounter: Payer: Self-pay | Admitting: Family

## 2024-04-29 ENCOUNTER — Ambulatory Visit (INDEPENDENT_AMBULATORY_CARE_PROVIDER_SITE_OTHER): Admitting: Family

## 2024-04-29 VITALS — BP 130/70 | HR 71 | Temp 98.2°F | Ht 63.0 in | Wt 128.8 lb

## 2024-04-29 DIAGNOSIS — F419 Anxiety disorder, unspecified: Secondary | ICD-10-CM | POA: Diagnosis not present

## 2024-04-29 DIAGNOSIS — I2584 Coronary atherosclerosis due to calcified coronary lesion: Secondary | ICD-10-CM | POA: Diagnosis not present

## 2024-04-29 DIAGNOSIS — Z0000 Encounter for general adult medical examination without abnormal findings: Secondary | ICD-10-CM

## 2024-04-29 DIAGNOSIS — I2510 Atherosclerotic heart disease of native coronary artery without angina pectoris: Secondary | ICD-10-CM | POA: Diagnosis not present

## 2024-04-29 MED ORDER — CLONAZEPAM 0.5 MG PO TABS: 0.5000 mg | ORAL_TABLET | Freq: Every evening | ORAL | 1 refills | Status: AC | PRN

## 2024-04-29 NOTE — Progress Notes (Unsigned)
 Assessment & Plan:  Anxiety     Return precautions given.   Risks, benefits, and alternatives of the medications and treatment plan prescribed today were discussed, and patient expressed understanding.   Education regarding symptom management and diagnosis given to patient on AVS either electronically or printed.  No follow-ups on file.  Bascom Bossier, FNP  Subjective:    Patient ID: Rachael Graham, female    DOB: [birth year], 73 y.o.   MRN: [id number]  CC: Rachael Freiberger is a 73 y.o. female who presents today for physical exam.    HPI: She feels well No new complaints  She is moving     Request refill of Ativan  0.5 mg for very rare use at night due to anxiety.  She is having trouble staying asleep on klonopin.   Follow-up cardiology 02/05/2024 for nonobstructive coronary artery disease; continued on aspirin  81 mg, Lipitor 40 mg daily Colorectal Cancer Screening: UTD , repeat in 7 years, Dr. Ole Berkeley Breast Cancer Screening: Mammogram UTD Cervical Cancer Screening: Following with GYN, Dr Denman Fischer.  She is no longer screening for cervical cancer. Pap smear 09/16/2019 negative malignancy  Bone Health screening/DEXA for 65+: Normal 02/2023  Lung Cancer Screening: Doesn't have 20 year pack year history and age > 38 years yo 69 years  AAA screen for all men and women aged 40 to 64 with h/o tobacco No known family history AAA        Tetanus - UTD Exercise: No regular exercise.Plans to start walking again.  Alcohol use: Occasional Smoking/tobacco use: Former smoker, quit more than 15 years ago. Health Maintenance  Topic Date Due   COVID-19 Vaccine (3 - Pfizer risk series) 03/02/2020   Zoster (Shingles) Vaccine (1 of 2) 07/30/2024*   Medicare Annual Wellness Visit  05/30/2026*   Flu Shot  07/25/2024   Mammogram  03/25/2026   DTaP/Tdap/Td vaccine (3 - Td or Tdap) 08/07/2027   Colon Cancer Screening  02/22/2028   Pneumonia Vaccine  Completed   DEXA scan (bone density measurement)   Completed   Hepatitis C Screening  Completed   HPV Vaccine  Aged Out   Meningitis B Vaccine  Aged Out  *Topic was postponed. The date shown is not the original due date.    ALLERGIES: Patient has no known allergies.  Current Outpatient Medications on File Prior to Visit  Medication Sig Dispense Refill   aspirin  EC 81 MG tablet Take 1 tablet (81 mg total) by mouth daily. Swallow whole. 90 tablet 3   atorvastatin  (LIPITOR) 40 MG tablet Take 1 tablet (40 mg total) by mouth daily. 90 tablet 3   calcium  carbonate (TUMS - DOSED IN MG ELEMENTAL CALCIUM ) 500 MG chewable tablet Chew 1 tablet by mouth as needed for indigestion or heartburn.     ibuprofen (ADVIL) 100 MG tablet Take 100 mg by mouth as needed for fever. Take three tablets daily as needed.     LORazepam  (ATIVAN ) 0.5 MG tablet Take 1 tablet (0.5 mg total) by mouth 2 (two) times daily as needed for anxiety. 30 tablet 1   pantoprazole  (PROTONIX ) 40 MG tablet Take 1 tablet (40 mg total) by mouth daily. 90 tablet 3   No current facility-administered medications on file prior to visit.    Review of Systems    Objective:    BP 130/70   Pulse 71   Temp 98.2 F (36.8 C) (Oral)   Ht 5\' 3"  (1.6 m)   Wt 128 lb 12.8 oz (58.4 kg)  SpO2 96%   BMI 22.82 kg/m   BP Readings from Last 3 Encounters:  04/29/24 130/70  02/05/24 132/76  03/13/23 137/69   Wt Readings from Last 3 Encounters:  04/29/24 128 lb 12.8 oz (58.4 kg)  02/05/24 130 lb (59 kg)  03/13/23 130 lb 8 oz (59.2 kg)    Physical Exam

## 2024-04-29 NOTE — Patient Instructions (Addendum)
 Call insurance to see if covers screening abdominal aneurysm, AAA screen , ultrasound test  Change to Klonopin and let me know if you sleep better  Health Maintenance for Postmenopausal Women Menopause is a normal process in which your ability to get pregnant comes to an end. This process happens slowly over many months or years, usually between the ages of 28 and 51. Menopause is complete when you have missed your menstrual period for 12 months. It is important to talk with your health care provider about some of the most common conditions that affect women after menopause (postmenopausal women). These include heart disease, cancer, and bone loss (osteoporosis). Adopting a healthy lifestyle and getting preventive care can help to promote your health and wellness. The actions you take can also lower your chances of developing some of these common conditions. What are the signs and symptoms of menopause? During menopause, you may have the following symptoms: Hot flashes. These can be moderate or severe. Night sweats. Decrease in sex drive. Mood swings. Headaches. Tiredness (fatigue). Irritability. Memory problems. Problems falling asleep or staying asleep. Talk with your health care provider about treatment options for your symptoms. Do I need hormone replacement therapy? Hormone replacement therapy is effective in treating symptoms that are caused by menopause, such as hot flashes and night sweats. Hormone replacement carries certain risks, especially as you become older. If you are thinking about using estrogen or estrogen with progestin, discuss the benefits and risks with your health care provider. How can I reduce my risk for heart disease and stroke? The risk of heart disease, heart attack, and stroke increases as you age. One of the causes may be a change in the body's hormones during menopause. This can affect how your body uses dietary fats, triglycerides, and cholesterol. Heart attack  and stroke are medical emergencies. There are many things that you can do to help prevent heart disease and stroke. Watch your blood pressure High blood pressure causes heart disease and increases the risk of stroke. This is more likely to develop in people who have high blood pressure readings or are overweight. Have your blood pressure checked: Every 3-5 years if you are 73-12 years of age. Every year if you are 91 years old or older. Eat a healthy diet  Eat a diet that includes plenty of vegetables, fruits, low-fat dairy products, and lean protein. Do not eat a lot of foods that are high in solid fats, added sugars, or sodium. Get regular exercise Get regular exercise. This is one of the most important things you can do for your health. Most adults should: Try to exercise for at least 150 minutes each week. The exercise should increase your heart rate and make you sweat (moderate-intensity exercise). Try to do strengthening exercises at least twice each week. Do these in addition to the moderate-intensity exercise. Spend less time sitting. Even light physical activity can be beneficial. Other tips Work with your health care provider to achieve or maintain a healthy weight. Do not use any products that contain nicotine or tobacco. These products include cigarettes, chewing tobacco, and vaping devices, such as e-cigarettes. If you need help quitting, ask your health care provider. Know your numbers. Ask your health care provider to check your cholesterol and your blood sugar (glucose). Continue to have your blood tested as directed by your health care provider. Do I need screening for cancer? Depending on your health history and family history, you may need to have cancer screenings at different stages  of your life. This may include screening for: Breast cancer. Cervical cancer. Lung cancer. Colorectal cancer. What is my risk for osteoporosis? After menopause, you may be at increased risk  for osteoporosis. Osteoporosis is a condition in which bone destruction happens more quickly than new bone creation. To help prevent osteoporosis or the bone fractures that can happen because of osteoporosis, you may take the following actions: If you are 29-89 years old, get at least 1,000 mg of calcium  and at least 600 international units (IU) of vitamin D  per day. If you are older than age 90 but younger than age 60, get at least 1,200 mg of calcium  and at least 600 international units (IU) of vitamin D  per day. If you are older than age 89, get at least 1,200 mg of calcium  and at least 800 international units (IU) of vitamin D  per day. Smoking and drinking excessive alcohol increase the risk of osteoporosis. Eat foods that are rich in calcium  and vitamin D , and do weight-bearing exercises several times each week as directed by your health care provider. How does menopause affect my mental health? Depression may occur at any age, but it is more common as you become older. Common symptoms of depression include: Feeling depressed. Changes in sleep patterns. Changes in appetite or eating patterns. Feeling an overall lack of motivation or enjoyment of activities that you previously enjoyed. Frequent crying spells. Talk with your health care provider if you think that you are experiencing any of these symptoms. General instructions See your health care provider for regular wellness exams and vaccines. This may include: Scheduling regular health, dental, and eye exams. Getting and maintaining your vaccines. These include: Influenza vaccine. Get this vaccine each year before the flu season begins. Pneumonia vaccine. Shingles vaccine. Tetanus, diphtheria, and pertussis (Tdap) booster vaccine. Your health care provider may also recommend other immunizations. Tell your health care provider if you have ever been abused or do not feel safe at home. Summary Menopause is a normal process in which your  ability to get pregnant comes to an end. This condition causes hot flashes, night sweats, decreased interest in sex, mood swings, headaches, or lack of sleep. Treatment for this condition may include hormone replacement therapy. Take actions to keep yourself healthy, including exercising regularly, eating a healthy diet, watching your weight, and checking your blood pressure and blood sugar levels. Get screened for cancer and depression. Make sure that you are up to date with all your vaccines. This information is not intended to replace advice given to you by your health care provider. Make sure you discuss any questions you have with your health care provider. Document Revised: 05/02/2021 Document Reviewed: 05/02/2021 Elsevier Patient Education  2024 [employer].

## 2024-04-30 NOTE — Assessment & Plan Note (Signed)
 Encouraged starting exercise program.  Deferred clinical breast exam due to patient preference.  Deferred pelvic exam in the absence of symptoms and she is no longer screening for cervical cancer.  Discussed AAA screening: 65-75 with history of tobacco use.  She has no family history of AAA.  She will let me know if she would like pursue ultrasound after checking with insurance.

## 2024-04-30 NOTE — Assessment & Plan Note (Signed)
 Chronic, suboptimal control.  Discontinue Ativan , trial Klonopin 0.5 nightly as needed

## 2024-05-20 ENCOUNTER — Encounter: Payer: Self-pay | Admitting: Cardiology

## 2024-05-21 ENCOUNTER — Other Ambulatory Visit: Payer: Self-pay | Admitting: Family

## 2024-05-21 DIAGNOSIS — K219 Gastro-esophageal reflux disease without esophagitis: Secondary | ICD-10-CM

## 2024-05-23 NOTE — Telephone Encounter (Signed)
 Left voicemail for return call

## 2024-06-04 ENCOUNTER — Encounter: Admitting: Family

## 2024-06-17 ENCOUNTER — Other Ambulatory Visit

## 2024-06-25 ENCOUNTER — Encounter: Payer: Self-pay | Admitting: Family

## 2024-06-25 NOTE — Telephone Encounter (Signed)
 Spoke to pt informed her that labs were ordered she just needs to schedule appt. Pt verbalized undesstanding

## 2024-06-30 ENCOUNTER — Other Ambulatory Visit

## 2024-07-02 ENCOUNTER — Ambulatory Visit: Admitting: Cardiology

## 2024-07-02 ENCOUNTER — Other Ambulatory Visit (INDEPENDENT_AMBULATORY_CARE_PROVIDER_SITE_OTHER)

## 2024-07-02 DIAGNOSIS — I2584 Coronary atherosclerosis due to calcified coronary lesion: Secondary | ICD-10-CM

## 2024-07-02 DIAGNOSIS — F419 Anxiety disorder, unspecified: Secondary | ICD-10-CM

## 2024-07-02 DIAGNOSIS — I2510 Atherosclerotic heart disease of native coronary artery without angina pectoris: Secondary | ICD-10-CM

## 2024-07-02 DIAGNOSIS — Z0000 Encounter for general adult medical examination without abnormal findings: Secondary | ICD-10-CM | POA: Diagnosis not present

## 2024-07-02 LAB — CBC WITH DIFFERENTIAL/PLATELET
Basophils Absolute: 0.1 K/uL (ref 0.0–0.1)
Basophils Relative: 0.9 % (ref 0.0–3.0)
Eosinophils Absolute: 0.2 K/uL (ref 0.0–0.7)
Eosinophils Relative: 3.3 % (ref 0.0–5.0)
HCT: 40.8 % (ref 36.0–46.0)
Hemoglobin: 13.3 g/dL (ref 12.0–15.0)
Lymphocytes Relative: 38.8 % (ref 12.0–46.0)
Lymphs Abs: 2.3 K/uL (ref 0.7–4.0)
MCHC: 32.7 g/dL (ref 30.0–36.0)
MCV: 89.5 fl (ref 78.0–100.0)
Monocytes Absolute: 0.7 K/uL (ref 0.1–1.0)
Monocytes Relative: 11.4 % (ref 3.0–12.0)
Neutro Abs: 2.8 K/uL (ref 1.4–7.7)
Neutrophils Relative %: 45.6 % (ref 43.0–77.0)
Platelets: 238 K/uL (ref 150.0–400.0)
RBC: 4.56 Mil/uL (ref 3.87–5.11)
RDW: 13.2 % (ref 11.5–15.5)
WBC: 6.1 K/uL (ref 4.0–10.5)

## 2024-07-02 LAB — LIPID PANEL
Cholesterol: 141 mg/dL (ref 0–200)
HDL: 68 mg/dL (ref >39.00)
LDL Cholesterol: 61 mg/dL (ref 0–99)
NonHDL: 73.43
Total CHOL/HDL Ratio: 2
Triglycerides: 61 mg/dL (ref 0.0–149.0)
VLDL: 12.2 mg/dL (ref 0.0–40.0)

## 2024-07-02 LAB — VITAMIN D 25 HYDROXY (VIT D DEFICIENCY, FRACTURES): VITD: 41.83 ng/mL (ref 30.00–100.00)

## 2024-07-02 LAB — COMPREHENSIVE METABOLIC PANEL WITH GFR
ALT: 34 U/L (ref 0–35)
AST: 38 U/L — ABNORMAL HIGH (ref 0–37)
Albumin: 4.2 g/dL (ref 3.5–5.2)
Alkaline Phosphatase: 68 U/L (ref 39–117)
BUN: 22 mg/dL (ref 6–23)
CO2: 34 meq/L — ABNORMAL HIGH (ref 19–32)
Calcium: 9.1 mg/dL (ref 8.4–10.5)
Chloride: 104 meq/L (ref 96–112)
Creatinine, Ser: 0.68 mg/dL (ref 0.40–1.20)
GFR: 88.08 mL/min (ref >60.00)
Glucose, Bld: 99 mg/dL (ref 70–99)
Potassium: 4.3 meq/L (ref 3.5–5.1)
Sodium: 142 meq/L (ref 135–145)
Total Bilirubin: 1.4 mg/dL — ABNORMAL HIGH (ref 0.2–1.2)
Total Protein: 6.6 g/dL (ref 6.0–8.3)

## 2024-07-02 LAB — TSH: TSH: 1.11 u[IU]/mL (ref 0.35–5.50)

## 2024-07-02 LAB — HEMOGLOBIN A1C: Hgb A1c MFr Bld: 6.2 % (ref 4.6–6.5)

## 2024-07-07 ENCOUNTER — Ambulatory Visit: Payer: Self-pay | Admitting: Family

## 2024-07-07 DIAGNOSIS — R899 Unspecified abnormal finding in specimens from other organs, systems and tissues: Secondary | ICD-10-CM

## 2024-07-08 NOTE — Telephone Encounter (Signed)
 Copied from CRM [id number]. Topic: Clinical - Request for Lab/Test Order >> Jul 08, 2024  9:42 AM Suzen RAMAN wrote: Reason for CRM: Patient would like repeat labs ordered per provider and lab appt scheduled prior to upcoming appointment on August 19, 2024.   CB#[phone]

## 2024-07-10 ENCOUNTER — Other Ambulatory Visit: Payer: Self-pay | Admitting: Family

## 2024-07-10 DIAGNOSIS — N951 Menopausal and female climacteric states: Secondary | ICD-10-CM

## 2024-07-14 ENCOUNTER — Ambulatory Visit: Admitting: Family

## 2024-08-12 ENCOUNTER — Other Ambulatory Visit (INDEPENDENT_AMBULATORY_CARE_PROVIDER_SITE_OTHER)

## 2024-08-12 DIAGNOSIS — N951 Menopausal and female climacteric states: Secondary | ICD-10-CM | POA: Diagnosis not present

## 2024-08-12 DIAGNOSIS — R899 Unspecified abnormal finding in specimens from other organs, systems and tissues: Secondary | ICD-10-CM | POA: Diagnosis not present

## 2024-08-12 LAB — FOLLICLE STIMULATING HORMONE: FSH: 56.5 m[IU]/mL

## 2024-08-12 LAB — HEPATIC FUNCTION PANEL
ALT: 33 U/L (ref 0–35)
AST: 33 U/L (ref 0–37)
Albumin: 4.3 g/dL (ref 3.5–5.2)
Alkaline Phosphatase: 77 U/L (ref 39–117)
Bilirubin, Direct: 0.3 mg/dL (ref 0.0–0.3)
Total Bilirubin: 1.7 mg/dL — ABNORMAL HIGH (ref 0.2–1.2)
Total Protein: 6.4 g/dL (ref 6.0–8.3)

## 2024-08-14 ENCOUNTER — Ambulatory Visit: Payer: Self-pay | Admitting: Family

## 2024-08-19 ENCOUNTER — Encounter: Payer: Self-pay | Admitting: Family

## 2024-08-19 ENCOUNTER — Ambulatory Visit (INDEPENDENT_AMBULATORY_CARE_PROVIDER_SITE_OTHER): Admitting: Family

## 2024-08-19 DIAGNOSIS — K824 Cholesterolosis of gallbladder: Secondary | ICD-10-CM

## 2024-08-19 DIAGNOSIS — N951 Menopausal and female climacteric states: Secondary | ICD-10-CM | POA: Diagnosis not present

## 2024-08-19 NOTE — Patient Instructions (Signed)
 Please let me know when I can order repeat ultrasound of gallbladder polyp.  Nice to see you

## 2024-08-19 NOTE — Progress Notes (Unsigned)
   Assessment & Plan:  There are no diagnoses linked to this encounter.   Return precautions given.   Risks, benefits, and alternatives of the medications and treatment plan prescribed today were discussed, and patient expressed understanding.   Education regarding symptom management and diagnosis given to patient on AVS either electronically or printed.  No follow-ups on file.  Rollene Northern, FNP  Subjective:    Patient ID: Rachael Graham, female    DOB: 23-Aug-1951, 73 y.o.   MRN: [id number]  CC: Rachael Graham is a 73 y.o. female who presents today for follow up.   HPI: She had episodic 'hot flashes' while moving   She thinks r/t stress.   No unusual bone bone pain, fever, chills, cough       Total bilirubin 1.6 in 2013   Allergies: Patient has no known allergies. Current Outpatient Medications on File Prior to Visit  Medication Sig Dispense Refill  . aspirin  EC 81 MG tablet Take 1 tablet (81 mg total) by mouth daily. Swallow whole. 90 tablet 3  . atorvastatin  (LIPITOR) 40 MG tablet Take 1 tablet (40 mg total) by mouth daily. 90 tablet 3  . calcium  carbonate (TUMS - DOSED IN MG ELEMENTAL CALCIUM ) 500 MG chewable tablet Chew 1 tablet by mouth as needed for indigestion or heartburn.    . clonazePAM  (KLONOPIN ) 0.5 MG tablet Take 1 tablet (0.5 mg total) by mouth at bedtime as needed for anxiety. 30 tablet 1  . ibuprofen (ADVIL) 100 MG tablet Take 100 mg by mouth as needed for fever. Take three tablets daily as needed.    . pantoprazole  (PROTONIX ) 40 MG tablet TAKE ONE TABLET BY MOUTH ONE TIME DAILY 90 tablet 3   No current facility-administered medications on file prior to visit.    Review of Systems    Objective:    BP 124/70   Pulse 79   Temp 98.2 F (36.8 C) (Oral)   Ht 5' 3 (1.6 m)   Wt 124 lb 9.6 oz (56.5 kg)   SpO2 97%   BMI 22.07 kg/m  BP Readings from Last 3 Encounters:  08/19/24 124/70  04/29/24 130/70  02/05/24 132/76   Wt Readings from Last 3  Encounters:  08/19/24 124 lb 9.6 oz (56.5 kg)  04/29/24 128 lb 12.8 oz (58.4 kg)  02/05/24 130 lb (59 kg)    Physical Exam

## 2024-08-19 NOTE — Assessment & Plan Note (Signed)
 Reviewed ultrasound of potential sludge versus polyp.  Declines US  to monitor polyp during this visit however plans to do in the near future to monitor progression. She will let me know when I can order repeat ultrasound

## 2024-08-20 ENCOUNTER — Encounter: Payer: Self-pay | Admitting: Family

## 2024-08-21 DIAGNOSIS — N951 Menopausal and female climacteric states: Secondary | ICD-10-CM | POA: Insufficient documentation

## 2024-08-21 NOTE — Assessment & Plan Note (Addendum)
 Resolved at this time.  Fortunately no B symptoms.  TSH, CBC lab up-to-date 06/2024  Patient politely declines further evaluation at this time.  We did discuss the use of black cohosh over-the-counter.

## 2024-08-21 NOTE — Assessment & Plan Note (Signed)
 Chronic, stable.  Review of elevated total bilirubin since 2013.  Reviewed right upper quadrant ultrasound from 2022.  No further evaluation needed at this time.

## 2024-09-12 DIAGNOSIS — H04123 Dry eye syndrome of bilateral lacrimal glands: Secondary | ICD-10-CM | POA: Diagnosis not present

## 2024-10-14 ENCOUNTER — Encounter: Payer: Self-pay | Admitting: Family

## 2024-10-15 ENCOUNTER — Other Ambulatory Visit: Payer: Self-pay | Admitting: Family

## 2024-10-15 DIAGNOSIS — K824 Cholesterolosis of gallbladder: Secondary | ICD-10-CM

## 2024-10-15 NOTE — Telephone Encounter (Signed)
 Noted

## 2024-11-04 ENCOUNTER — Ambulatory Visit

## 2024-11-18 ENCOUNTER — Ambulatory Visit

## 2024-11-18 DIAGNOSIS — L821 Other seborrheic keratosis: Secondary | ICD-10-CM | POA: Diagnosis not present

## 2024-11-18 DIAGNOSIS — D1801 Hemangioma of skin and subcutaneous tissue: Secondary | ICD-10-CM | POA: Diagnosis not present

## 2024-11-18 DIAGNOSIS — D229 Melanocytic nevi, unspecified: Secondary | ICD-10-CM

## 2024-11-18 DIAGNOSIS — L814 Other melanin hyperpigmentation: Secondary | ICD-10-CM

## 2024-11-18 DIAGNOSIS — Z1283 Encounter for screening for malignant neoplasm of skin: Secondary | ICD-10-CM

## 2024-11-18 DIAGNOSIS — W908XXA Exposure to other nonionizing radiation, initial encounter: Secondary | ICD-10-CM | POA: Diagnosis not present

## 2024-11-18 DIAGNOSIS — L578 Other skin changes due to chronic exposure to nonionizing radiation: Secondary | ICD-10-CM

## 2024-11-18 NOTE — Patient Instructions (Signed)

## 2024-11-18 NOTE — Progress Notes (Signed)
    Subjective   Rachael Graham is a 73 y.o. female who presents for the following: Total body skin exam for skin cancer screening and mole check. The patient has spots, moles and lesions to be evaluated, some may be new or changing and the patient may have concern these could be cancer.. Patient is new patient  Today patient reports: No areas of concern.   Review of Systems:    No other skin or systemic complaints except as noted in HPI or Assessment and Plan.  The following portions of the chart were reviewed this encounter and updated as appropriate: medications, allergies, medical history  Relevant Medical History:  n/a   Objective  (SKPE) Well appearing patient in no apparent distress; mood and affect are within normal limits. Examination was performed of the: Full Skin Examination: scalp, head, eyes, ears, nose, lips, neck, chest, axillae, abdomen, back, buttocks, bilateral upper extremities, bilateral lower extremities, hands, feet, fingers, toes, fingernails, and toenails.   Examination notable for: SKIN EXAM, Angioma(s): Scattered red vascular papule(s)  , Lentigo/lentigines: Scattered pigmented macules that are tan to brown in color and are somewhat non-uniform in shape and concentrated in the sun-exposed areas, Nevus/nevi: Scattered well-demarcated, regular, pigmented macule(s) and/or papule(s)  , Seborrheic Keratosis(es): Stuck-on appearing keratotic papule(s) on the trunk, none  irritated with redness, crusting, edema, and/or partial avulsion, Actinic Damage/Elastosis: chronic sun damage: dyspigmentation, telangiectasia, and wrinkling  Examination limited by: Undergarments and Patient deferred removal       Assessment & Plan  (SKAP)   SKIN CANCER SCREENING PERFORMED TODAY.  BENIGN SKIN FINDINGS  - Lentigines  - Seborrheic keratoses  - Hemangiomas   - Nevus/Multiple Benign Nevi - Reassurance provided regarding the benign appearance of lesions noted on exam today; no  treatment is indicated in the absence of symptoms/changes. - Reinforced importance of photoprotective strategies including liberal and frequent sunscreen use of a broad-spectrum SPF 30 or greater, use of protective clothing, and sun avoidance for prevention of cutaneous malignancy and photoaging.  Counseled patient on the importance of regular self-skin monitoring as well as routine clinical skin examinations as scheduled.   ACTINIC DAMAGE - Chronic condition, secondary to cumulative UV/sun exposure - Recommend daily broad spectrum sunscreen SPF 30+ to sun-exposed areas, reapply every 2 hours as needed.  - Staying in the shade or wearing long sleeves, sun glasses (UVA+UVB protection) and wide brim hats (4-inch brim around the entire circumference of the hat) are also recommended for sun protection.  - Call for new or changing lesions.  Level of service outlined above   Patient instructions (SKPI)   Procedures, orders, diagnosis for this visit:    There are no diagnoses linked to this encounter.  Return to clinic: Return in about 1 year (around 11/18/2025) for TBSE.  I, Emerick Ege, CMA am acting as scribe for Lauraine JAYSON Kanaris, MD.   Documentation: I have reviewed the above documentation for accuracy and completeness, and I agree with the above.  Lauraine JAYSON Kanaris, MD

## 2024-11-28 ENCOUNTER — Ambulatory Visit

## 2024-12-11 ENCOUNTER — Ambulatory Visit

## 2025-01-08 ENCOUNTER — Ambulatory Visit
Admission: RE | Admit: 2025-01-08 | Discharge: 2025-01-08 | Disposition: A | Discharge status: Home / Self Care | Source: Ambulatory Visit | Attending: Family | Admitting: Family

## 2025-01-08 DIAGNOSIS — K824 Cholesterolosis of gallbladder: Secondary | ICD-10-CM | POA: Insufficient documentation

## 2025-01-09 ENCOUNTER — Encounter: Payer: Self-pay | Admitting: Cardiology

## 2025-01-09 ENCOUNTER — Ambulatory Visit: Payer: Self-pay | Admitting: Family

## 2025-01-09 DIAGNOSIS — K824 Cholesterolosis of gallbladder: Secondary | ICD-10-CM

## 2025-02-04 ENCOUNTER — Ambulatory Visit: Admitting: Cardiology

## 2025-02-10 ENCOUNTER — Ambulatory Visit: Admitting: Family

## 2025-04-30 ENCOUNTER — Encounter: Admitting: Family

## 2025-11-23 ENCOUNTER — Ambulatory Visit
